# Patient Record
Sex: Female | Born: 1990 | Race: White | Hispanic: No | Marital: Single | Smoking: Former smoker
Health system: Southern US, Community
[De-identification: ages and names within clinical notes are randomized; demographics above are authoritative.]

## PROBLEM LIST (undated history)

## (undated) DIAGNOSIS — I471 Supraventricular tachycardia, unspecified: Secondary | ICD-10-CM

## (undated) DIAGNOSIS — B009 Herpesviral infection, unspecified: Secondary | ICD-10-CM

## (undated) DIAGNOSIS — F419 Anxiety disorder, unspecified: Secondary | ICD-10-CM

## (undated) DIAGNOSIS — Z808 Family history of malignant neoplasm of other organs or systems: Secondary | ICD-10-CM

## (undated) DIAGNOSIS — A749 Chlamydial infection, unspecified: Secondary | ICD-10-CM

## (undated) HISTORY — DX: Family history of malignant neoplasm of other organs or systems: Z80.8

## (undated) HISTORY — DX: Anxiety disorder, unspecified: F41.9

## (undated) HISTORY — DX: Herpesviral infection, unspecified: B00.9

## (undated) HISTORY — DX: Supraventricular tachycardia: I47.1

## (undated) HISTORY — PX: WISDOM TOOTH EXTRACTION: SHX21

## (undated) HISTORY — DX: Chlamydial infection, unspecified: A74.9

## (undated) HISTORY — DX: Supraventricular tachycardia, unspecified: I47.10

---

## 2019-12-12 NOTE — Progress Notes (Signed)
PCP:  Patient, No Pcp Per   Chief Complaint  Patient presents with  . Gynecologic Exam    flu shot  . STD testing     HPI:      Ms. Tiffany Watkins is a 29 y.o. No obstetric history on file. who LMP was Patient's last menstrual period was 11/25/2019 (approximate)., presents today for her NP annual examination.  Her menses are infrequent light bleeding with IUD. Dysmenorrhea mild, occurring.  Sex activity: not sexually active. Was last yr. Kyleena placed ~04/2018. Last Pap: not recent; no abn paps in past requiring tx Hx of STDs: chlamydia last yr, did tx; no TOC. 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   There is no FH of breast cancer. There is no FH of ovarian cancer. The patient does do self-breast exams. Mom with metastatic melanoma. Pt hasn't seen derm.   Tobacco use: The patient denies current or previous tobacco use. Alcohol use: social drinker No drug use.  Exercise: moderately active  She does not get adequate calcium but not Vitamin D in her diet.  She takes ativan sparingly for situational anxiety. Needs RF.   Past Medical History:  Diagnosis Date  . Anxiety   . Chlamydia   . Family history of melanoma   . 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 WISDOM TOOTH EXTRACTION      Family History  Problem Relation Age of Onset  . Hypertension Mother   . Melanoma Mother        mets to lungs  . Hypertension Father   . Breast cancer Neg Hx   . Ovarian cancer Neg Hx     Social History   Socioeconomic History  . Marital status: Single    Spouse name: Not on file  . Number of children: Not on file  . Years of education: Not on file  . Highest education level: Not on file  Occupational History  . Not on file  Tobacco Use  . Smoking status: Former Research scientist (life sciences)  . Smokeless tobacco: Never Used  Substance and Sexual Activity  . Alcohol use: Yes  . Drug use: Never  .  Sexual activity: Not Currently    Birth control/protection: I.U.D.    Comment: Kyleena  Other Topics Concern  . Not on file  Social History Narrative  . Not on file   Social Determinants of Health   Financial Resource Strain:   . Difficulty of Paying Living Expenses: Not on file  Food Insecurity:   . Worried About Charity fundraiser in the Last Year: Not on file  . Ran Out of Food in the Last Year: Not on file  Transportation Needs:   . Lack of Transportation (Medical): Not on file  . Lack of Transportation (Non-Medical): Not on file  Physical Activity:   . Days of Exercise per Week: Not on file  . Minutes of Exercise per Session: Not on file  Stress:   . Feeling of Stress : Not on file  Social Connections:   . Frequency of Communication with Friends and Family: Not on file  . Frequency of Social Gatherings with Friends and Family: Not on file  . Attends Religious Services: Not on file  . Active Member of Clubs or Organizations: Not on file  . Attends Archivist Meetings: Not on file  . Marital Status: Not  on file  Intimate Partner Violence:   . Fear of Current or Ex-Partner: Not on file  . Emotionally Abused: Not on file  . Physically Abused: Not on file  . Sexually Abused: Not on file     Current Outpatient Medications:  .  Levonorgestrel 19.5 MG IUD, by Intrauterine route., Disp: , Rfl:  .  LORazepam (ATIVAN) 0.5 MG tablet, Take 1 tablet (0.5 mg total) by mouth every 8 (eight) hours as needed for anxiety., Disp: 30 tablet, Rfl: 0     ROS:  Review of Systems  Constitutional: Negative for fatigue, fever and unexpected weight change.  Respiratory: Negative for cough, shortness of breath and wheezing.   Cardiovascular: Negative for chest pain, palpitations and leg swelling.  Gastrointestinal: Negative for blood in stool, constipation, diarrhea, nausea and vomiting.  Endocrine: Negative for cold intolerance, heat intolerance and polyuria.  Genitourinary:  Negative for dyspareunia, dysuria, flank pain, frequency, genital sores, hematuria, menstrual problem, pelvic pain, urgency, vaginal bleeding, vaginal discharge and vaginal pain.  Musculoskeletal: Negative for back pain, joint swelling and myalgias.  Skin: Negative for rash.  Neurological: Negative for dizziness, syncope, light-headedness, numbness and headaches.  Hematological: Negative for adenopathy.  Psychiatric/Behavioral: Positive for agitation. Negative for confusion, sleep disturbance and suicidal ideas. The patient is not nervous/anxious.   BREAST: No symptoms   Objective: BP 110/90   Ht 5\' 6"  (1.676 m)   Wt 276 lb (125.2 kg)   LMP 11/25/2019 (Approximate)   BMI 44.55 kg/m    Physical Exam Constitutional:      Appearance: She is well-developed.  Genitourinary:     Vulva, vagina, cervix, uterus, right adnexa and left adnexa normal.     No vulval lesion or tenderness noted.     No vaginal discharge, erythema or tenderness.     No cervical polyp.     IUD strings visualized.     Uterus is not enlarged or tender.     No right or left adnexal mass present.     Right adnexa not tender.     Left adnexa not tender.  Neck:     Thyroid: No thyromegaly.  Cardiovascular:     Rate and Rhythm: Normal rate and regular rhythm.     Heart sounds: Normal heart sounds. No murmur.  Pulmonary:     Effort: Pulmonary effort is normal.     Breath sounds: Normal breath sounds.  Chest:     Breasts:        Right: No mass, nipple discharge, skin change or tenderness.        Left: No mass, nipple discharge, skin change or tenderness.  Abdominal:     Palpations: Abdomen is soft.     Tenderness: There is no abdominal tenderness. There is no guarding.  Musculoskeletal:        General: Normal range of motion.     Cervical back: Normal range of motion.  Neurological:     General: No focal deficit present.     Mental Status: She is alert and oriented to person, place, and time.     Cranial  Nerves: No cranial nerve deficit.  Skin:    General: Skin is warm and dry.  Psychiatric:        Mood and Affect: Mood normal.        Behavior: Behavior normal.        Thought Content: Thought content normal.        Judgment: Judgment normal.  Vitals reviewed.  Assessment/Plan: Encounter for annual routine gynecological examination  Cervical cancer screening - Plan: Cytology - PAP  Chlamydia--TOC today. Will f/u with results.   Screening for STD (sexually transmitted disease) - Plan: Cytology - PAP  Encounter for routine checking of intrauterine contraceptive device (IUD); IUD in place, due for removal ~6/24  Anxiety - Plan: LORazepam (ATIVAN) 0.5 MG tablet; Rx RF. Pt takes sparingly prn.  Family history of melanoma--recommend derm screening; pt plans to sched appt  Needs flu shot - Plan: Flu Vaccine QUAD 36+ mos IM (Fluarix, Quad PF)  Meds ordered this encounter  Medications  . LORazepam (ATIVAN) 0.5 MG tablet    Sig: Take 1 tablet (0.5 mg total) by mouth every 8 (eight) hours as needed for anxiety.    Dispense:  30 tablet    Refill:  0    Order Specific Question:   Supervising Provider    Answer:   Gae Dry [id number]             GYN counsel adequate intake of calcium and vitamin D, diet and exercise     F/U  Return in about 1 year (around 12/12/2020).  Cantrell Larouche B. Awilda Covin, PA-C 12/13/2019 9:14 AM

## 2019-12-12 NOTE — Patient Instructions (Signed)
I value your feedback and entrusting us with your care. If you get a [facility] patient survey, I would appreciate you taking the time to let us know about your experience today. Thank you!  As of October 17, 2019, your lab results will be released to your MyChart immediately, before I even have a chance to see them. Please give me time to review them and contact you if there are any abnormalities. Thank you for your patience.  

## 2019-12-13 ENCOUNTER — Encounter: Payer: Self-pay | Admitting: Obstetrics and Gynecology

## 2019-12-13 ENCOUNTER — Ambulatory Visit (INDEPENDENT_AMBULATORY_CARE_PROVIDER_SITE_OTHER): Payer: BC Managed Care – PPO | Admitting: Obstetrics and Gynecology

## 2019-12-13 ENCOUNTER — Other Ambulatory Visit (HOSPITAL_COMMUNITY)
Admission: RE | Admit: 2019-12-13 | Discharge: 2019-12-13 | Disposition: A | Discharge status: Home / Self Care | Payer: BC Managed Care – PPO | Source: Ambulatory Visit | Attending: Obstetrics and Gynecology | Admitting: Obstetrics and Gynecology

## 2019-12-13 ENCOUNTER — Other Ambulatory Visit: Payer: Self-pay

## 2019-12-13 VITALS — BP 110/90 | Ht 66.0 in | Wt 276.0 lb

## 2019-12-13 DIAGNOSIS — Z124 Encounter for screening for malignant neoplasm of cervix: Secondary | ICD-10-CM | POA: Insufficient documentation

## 2019-12-13 DIAGNOSIS — Z30431 Encounter for routine checking of intrauterine contraceptive device: Secondary | ICD-10-CM

## 2019-12-13 DIAGNOSIS — A749 Chlamydial infection, unspecified: Secondary | ICD-10-CM | POA: Insufficient documentation

## 2019-12-13 DIAGNOSIS — Z01419 Encounter for gynecological examination (general) (routine) without abnormal findings: Secondary | ICD-10-CM

## 2019-12-13 DIAGNOSIS — Z01411 Encounter for gynecological examination (general) (routine) with abnormal findings: Secondary | ICD-10-CM

## 2019-12-13 DIAGNOSIS — Z113 Encounter for screening for infections with a predominantly sexual mode of transmission: Secondary | ICD-10-CM | POA: Diagnosis present

## 2019-12-13 DIAGNOSIS — Z808 Family history of malignant neoplasm of other organs or systems: Secondary | ICD-10-CM

## 2019-12-13 DIAGNOSIS — Z23 Encounter for immunization: Secondary | ICD-10-CM | POA: Diagnosis not present

## 2019-12-13 DIAGNOSIS — F419 Anxiety disorder, unspecified: Secondary | ICD-10-CM | POA: Diagnosis not present

## 2019-12-13 MED ORDER — LORAZEPAM 0.5 MG PO TABS: 0.5000 mg | ORAL_TABLET | Freq: Three times a day (TID) | ORAL | 0 refills | Status: DC | PRN

## 2019-12-18 ENCOUNTER — Telehealth: Payer: Self-pay | Admitting: Obstetrics and Gynecology

## 2019-12-18 DIAGNOSIS — A749 Chlamydial infection, unspecified: Secondary | ICD-10-CM

## 2019-12-18 LAB — CYTOLOGY - PAP
Chlamydia: POSITIVE — AB
Comment: NEGATIVE
Comment: NORMAL
Neisseria Gonorrhea: NEGATIVE

## 2019-12-18 MED ORDER — AZITHROMYCIN 500 MG PO TABS: 1000.0000 mg | ORAL_TABLET | Freq: Once | ORAL | 0 refills | Status: AC

## 2019-12-18 NOTE — Telephone Encounter (Signed)
[facility] notified.

## 2019-12-18 NOTE — Telephone Encounter (Signed)
Patient is calling for labs results. Please advise. 

## 2019-12-18 NOTE — Telephone Encounter (Signed)
Pt aware of LGSIL pap and need for colpo. Appt sched.  Also aware of chlamydia, not currently sex active. Notify ex-partner. Rx azithro. RTO in 4 wks for TOC or at colpo if a few wks out. CMA to notify [facility].

## 2020-01-06 ENCOUNTER — Other Ambulatory Visit (HOSPITAL_COMMUNITY)
Admission: RE | Admit: 2020-01-06 | Discharge: 2020-01-06 | Disposition: A | Discharge status: Home / Self Care | Payer: BC Managed Care – PPO | Source: Ambulatory Visit | Attending: Obstetrics and Gynecology | Admitting: Obstetrics and Gynecology

## 2020-01-06 ENCOUNTER — Ambulatory Visit (INDEPENDENT_AMBULATORY_CARE_PROVIDER_SITE_OTHER): Payer: BC Managed Care – PPO | Admitting: Obstetrics and Gynecology

## 2020-01-06 ENCOUNTER — Encounter: Payer: Self-pay | Admitting: Obstetrics and Gynecology

## 2020-01-06 ENCOUNTER — Other Ambulatory Visit: Payer: Self-pay

## 2020-01-06 VITALS — BP 122/84 | HR 104 | Ht 66.0 in | Wt 273.0 lb

## 2020-01-06 DIAGNOSIS — Z113 Encounter for screening for infections with a predominantly sexual mode of transmission: Secondary | ICD-10-CM | POA: Diagnosis not present

## 2020-01-06 DIAGNOSIS — R87612 Low grade squamous intraepithelial lesion on cytologic smear of cervix (LGSIL): Secondary | ICD-10-CM | POA: Diagnosis present

## 2020-01-06 NOTE — Progress Notes (Signed)
Obstetrics & Gynecology Office Visit   Chief Complaint:  Chief Complaint  Patient presents with  . Colposcopy    referred by ABC/STD TOC today    History of Present Illness:Tiffany Watkins is a 29 y.o. woman who presents today for continued surveillance for history of dysplasia. Last pap obtained on 12/13/2019 revealed LGSIL.  No history of LEEP.  Not currently sexually active.  Also concurrently treated for chlamydia.    Pap/Treatment History:  02/16/2015 NIL 09/12/2013 NIL trichomonas 01/18/2012 NIL 02/17/2011 LGSIL with areas concerning for higher grade changes present 09/06/2010 LGSIL  Review of Systems: Review of Systems  Constitutional: Negative.   Gastrointestinal: Negative.   Genitourinary: Negative.      Past Medical History:  Past Medical History:  Diagnosis Date  . Anxiety   . Chlamydia   . Family history of melanoma   . 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 WISDOM TOOTH EXTRACTION      Gynecologic History: No LMP recorded. (Menstrual status: IUD).  Obstetric History: G0P0000  Family History:  Family History  Problem Relation Age of Onset  . Hypertension Mother   . Melanoma Mother        mets to lungs  . Hypertension Father   . Breast cancer Neg Hx   . Ovarian cancer Neg Hx     Social History:  Social History   Socioeconomic History  . Marital status: Single    Spouse name: Not on file  . Number of children: Not on file  . Years of education: Not on file  . Highest education level: Not on file  Occupational History  . Not on file  Tobacco Use  . Smoking status: Former Research scientist (life sciences)  . Smokeless tobacco: Never Used  Substance and Sexual Activity  . Alcohol use: Yes  . Drug use: Never  . Sexual activity: Not Currently    Birth control/protection: I.U.D.    Comment: Kyleena  Other Topics Concern  . Not on file  Social History Narrative  . Not on file    Social Determinants of Health   Financial Resource Strain:   . Difficulty of Paying Living Expenses: Not on file  Food Insecurity:   . Worried About Charity fundraiser in the Last Year: Not on file  . Ran Out of Food in the Last Year: Not on file  Transportation Needs:   . Lack of Transportation (Medical): Not on file  . Lack of Transportation (Non-Medical): Not on file  Physical Activity:   . Days of Exercise per Week: Not on file  . Minutes of Exercise per Session: Not on file  Stress:   . Feeling of Stress : Not on file  Social Connections:   . Frequency of Communication with Friends and Family: Not on file  . Frequency of Social Gatherings with Friends and Family: Not on file  . Attends Religious Services: Not on file  . Active Member of Clubs or Organizations: Not on file  . Attends Archivist Meetings: Not on file  . Marital Status: Not on file  Intimate Partner Violence:   . Fear of Current or Ex-Partner: Not on file  . Emotionally Abused: Not on file  . Physically Abused: Not on file  . Sexually Abused: Not on file    Allergies:  No Known Allergies  Medications: Prior to Admission medications   Medication  Sig Start Date End Date Taking? Authorizing Provider  Levonorgestrel 19.5 MG IUD by Intrauterine route.    [provider]  LORazepam (ATIVAN) 0.5 MG tablet Take 1 tablet (0.5 mg total) by mouth every 8 (eight) hours as needed for anxiety. [id number]   Copland, Deirdre Evener, PA-C    Physical Exam Vitals:  Vitals:   01/06/20 1030  BP: 122/84  Pulse: (!) 104   No LMP recorded. (Menstrual status: IUD).  General: NAD HEENT: normocephalic, anicteric Thyroid: no enlargement, no palpable nodules Pulmonary: No increased work of breathing Genitourinary:  External: Normal external female genitalia.  Normal urethral meatus, normal  Bartholin's and Skene's glands.    Vagina: Normal vaginal mucosa, no evidence of prolapse.    Cervix: Grossly normal  in appearance, no bleeding  Uterus: Non-enlarged, mobile, normal contour.  No CMT  Adnexa: ovaries non-enlarged, no adnexal masses  Rectal: deferred  Lymphatic: no evidence of inguinal lymphadenopathy Extremities: no edema, erythema, or tenderness Neurologic: Grossly intact Psychiatric: mood appropriate, affect full  Female chaperone present for pelvic and breast  portions of the physical exam   GYNECOLOGY CLINIC COLPOSCOPY PROCEDURE NOTE  29 y.o. G0P0000 here for colposcopy for low-grade squamous intraepithelial neoplasia (LGSIL - encompassing HPV,mild dysplasia,CIN I)  pap smear on 12/13/2019. Discussed underlying role for HPV infection in the development of cervical dysplasia, its natural history and progression/regression, need for surveillance.     Is the patient  pregnant: No LMP: No LMP recorded. (Menstrual status: IUD). Smoking status:  reports that she has quit smoking. She has never used smokeless tobacco. Contraception: IUD Number current sexual partners:  0 History of STD:  Yes chlamdydia and trichomonas Future fertility desired:  Yes  Patient given informed consent, signed copy in the chart, time out was performed.  The patient was position in dorsal lithotomy position. Speculum was placed the cervix was visualized.   After application of acetic acid colposcopic inspection of the cervix was undertaken.   Colposcopy adequate, full visualization of transformation zone: Yes acetowhite lesion(s) noted at 12 o'clock; corresponding biopsies obtained.   ECC specimen obtained:  Yes  All specimens were labeled and sent to pathology.   Patient was given post procedure instructions.  Will follow up pathology and manage accordingly.  Routine preventative health maintenance measures emphasized.  Physical Exam Genitourinary:           Assessment: 29 y.o. G0P0000 follow up for LGSIL pap  Plan: Problem List Items Addressed This Visit    None    Visit Diagnoses     LGSIL on Pap smear of cervix    -  Primary   Relevant Orders   Surgical pathology   Routine screening for STI (sexually transmitted infection)       Relevant Orders   Cervicovaginal ancillary only      - Follow up GC/CT obtained today.   - I had a lengthly discussion with Anona Bree  regarding the cause of dysplasia of the lower genital tract (including immunosuppression in the setting of HPV exposure and tobacco exposure). I explained the potential for progression to invasive malignancy, the recurrent nature of these lesions (and the need for close continued followup). Results of today's pap will dictate need for further evaluation and follow up per ASCCP guidelines..  We discussed that 80% of the population will have exposure to human papilloma virus (HPV) during their lifetime.  HPV is a large group of viruses, and are also the causative virus for common warts and genital  warts.  The pap smear tests for 13 high risk HPV strains that have some association with cervical cancer, but do not cause visual lesion such as warts.  HPV type 16 and 18 have the highest association with cervical cancer.  The vast majority of HPV infections will be uncomplicated at clear spontaneously in 12-18 months in non-immunocompromised patients.  Patient with compromised immune systems, those taking immunosuppressive drugs, or smoker have shown to have a lower clearance rate and higher persistence of HPV infection.    Currently there are no FDA approved treatments to promote HPV clearance.  Gardasil vaccination is available to prevent HPV infection, but this is only beneficial pre-exposure.  Abstaining from intercourse will not increase clearance  Lastly we stressed that if properly followed HPV should not lead to cervical cancer.   The goal of screening is to identify patient who develop precancerous lesions of the cervix and treat these prior to progression to frank cervical cancer.  The incidence of cervical cancer  is 7 cases per 100,000 women in the [place] a year.  This relatively low rate is in part due to universal screening as well as vaccination efforts.    - She is comfortable with the plan and had her questions answered.  - Return in about 1 year (around 01/05/2021) for annual.   Malachy Mood, MD, [place], [facility] 01/06/2020, 10:54 AM

## 2020-01-07 LAB — SURGICAL PATHOLOGY

## 2020-01-07 LAB — CERVICOVAGINAL ANCILLARY ONLY
Chlamydia: NEGATIVE
Comment: NEGATIVE
Comment: NEGATIVE
Comment: NORMAL
Neisseria Gonorrhea: NEGATIVE
Trichomonas: NEGATIVE

## 2020-02-13 ENCOUNTER — Other Ambulatory Visit: Payer: Self-pay

## 2020-02-13 ENCOUNTER — Ambulatory Visit: Payer: BC Managed Care – PPO | Admitting: Dermatology

## 2020-02-13 DIAGNOSIS — D235 Other benign neoplasm of skin of trunk: Secondary | ICD-10-CM

## 2020-02-13 DIAGNOSIS — L709 Acne, unspecified: Secondary | ICD-10-CM

## 2020-02-13 DIAGNOSIS — Z1283 Encounter for screening for malignant neoplasm of skin: Secondary | ICD-10-CM

## 2020-02-13 DIAGNOSIS — D2372 Other benign neoplasm of skin of left lower limb, including hip: Secondary | ICD-10-CM

## 2020-02-13 DIAGNOSIS — D225 Melanocytic nevi of trunk: Secondary | ICD-10-CM | POA: Diagnosis not present

## 2020-02-13 DIAGNOSIS — D229 Melanocytic nevi, unspecified: Secondary | ICD-10-CM | POA: Diagnosis not present

## 2020-02-13 DIAGNOSIS — L988 Other specified disorders of the skin and subcutaneous tissue: Secondary | ICD-10-CM

## 2020-02-13 DIAGNOSIS — L720 Epidermal cyst: Secondary | ICD-10-CM

## 2020-02-13 DIAGNOSIS — L578 Other skin changes due to chronic exposure to nonionizing radiation: Secondary | ICD-10-CM

## 2020-02-13 DIAGNOSIS — Z808 Family history of malignant neoplasm of other organs or systems: Secondary | ICD-10-CM

## 2020-02-13 DIAGNOSIS — L814 Other melanin hyperpigmentation: Secondary | ICD-10-CM

## 2020-02-13 NOTE — Progress Notes (Signed)
   New Patient Visit  Subjective  Tiffany Watkins is a 29 y.o. female who presents for the following: Nevus (wants to have moles checked, has one center chest under breasts that has gotten larger in past few years, and darker in the center for past year), discolored corners of mouth (patient states that it has a different texture to that area, has been there for past month or month and 1/2. not itch or pain noted, she just doesn't like the appearance), and No hx skin cancer (Patient has no hx of skin cancer. Her mother has a history of melanoma that metastasized to the lungs and is currently undergoing treatment.  The patient denies dark streaks at fingernails and toenails.   No dark streaks under finger or toe nails  Objective  Well appearing patient in no apparent distress; mood and affect are within normal limits.  A full examination was performed including scalp, head, eyes, ears, nose, lips, neck, chest, axillae, abdomen, back, buttocks, bilateral upper extremities, bilateral lower extremities, hands, feet, fingers, toes, fingernails, and toenails. All findings within normal limits unless otherwise noted below.  Objective  Right Cheek: Erythematous papules at face  Objective  Bilateral oral commisures: Volume loss at angles of mouth  Objective  Right Upper Back: Subcutaneous nodule.   Objective  Upper abdomen: 0.8 cm med brown thin papule with fried egg pattern  Right Inframammary Fold: Cluster of nevi right breast 7 o'clock  Left medial buttock: 1.0 cm thin brown thin papule with lighter focus at 11 o'clock  Images              Assessment & Plan  Acne, unspecified acne type Right Cheek  Not bothersome to patient at this time.  Will defer treatment.  Elastosis of skin Bilateral oral commisures  Discussed fillers for areas on corners of mouth. Patient defers  Epidermal inclusion cyst Right Upper Back  Benign, observe.  Discussed option of surgery if  removal is desired.  Patient defers   Nevus (3) Left medial buttock; Upper abdomen; Right Inframammary Fold  Benign-appearing.  Observation.  Call clinic for new or changing moles.  Recommend daily use of broad spectrum spf 30+ sunscreen to sun-exposed areas.  Photo today  Melanocytic Nevi - Tan-brown and/or pink-flesh-colored symmetric macules and papules - Benign appearing on exam today - Observation - Call clinic for new or changing moles - Recommend daily use of broad spectrum spf 30+ sunscreen to sun-exposed areas.   Lentigines - Scattered tan macules - Discussed due to sun exposure - Benign, observe - Call for any changes  Dermatofibroma - Firm pink/brown papulenodule with dimple sign - Benign appearing - Call for any changes Left shin, left buttock  Actinic Damage - diffuse scaly erythematous macules with underlying dyspigmentation - Recommend daily broad spectrum sunscreen SPF 30+ to sun-exposed areas, reapply every 2 hours as needed.  - Call for new or changing lesions.  Skin cancer screening performed today.   Return in about 1 year (around 02/12/2021) for TBSE.   I, Donzetta Kohut, CMA, am acting as scribe for Forest Gleason, MD .  Documentation: I have reviewed the above documentation for accuracy and completeness, and I agree with the above.  Forest Gleason, MD

## 2020-02-13 NOTE — Patient Instructions (Addendum)
Recommend daily broad spectrum sunscreen SPF 30+ to sun-exposed areas, reapply every 2 hours as needed. Call for new or changing lesions. Melanoma ABCDEs  Melanoma is the most dangerous type of skin cancer, and is the leading cause of death from skin disease.  You are more likely to develop melanoma if you:  Have light-colored skin, light-colored eyes, or red or blond hair  Spend a lot of time in the sun  Tan regularly, either outdoors or in a tanning bed  Have had blistering sunburns, especially during childhood  Have a close family member who has had a melanoma  Have atypical moles or large birthmarks  Early detection of melanoma is key since treatment is typically straightforward and cure rates are extremely high if we catch it early.   The first sign of melanoma is often a change in a mole or a new dark spot.  The ABCDE system is a way of remembering the signs of melanoma.  A for asymmetry:  The two halves do not match. B for border:  The edges of the growth are irregular. C for color:  A mixture of colors are present instead of an even brown color. D for diameter:  Melanomas are usually (but not always) greater than 6mm - the size of a pencil eraser. E for evolution:  The spot keeps changing in size, shape, and color.  Please check your skin once per month between visits. You can use a small mirror in front and a large mirror behind you to keep an eye on the back side or your body.   If you see any new or changing lesions before your next follow-up, please call to schedule a visit.  Please continue daily skin protection including broad spectrum sunscreen SPF 30+ to sun-exposed areas, reapplying every 2 hours as needed when you're outdoors.   

## 2020-02-20 ENCOUNTER — Encounter: Payer: Self-pay | Admitting: Dermatology

## 2020-05-07 DIAGNOSIS — F430 Acute stress reaction: Secondary | ICD-10-CM | POA: Diagnosis not present

## 2020-05-08 DIAGNOSIS — M545 Low back pain: Secondary | ICD-10-CM | POA: Diagnosis not present

## 2020-06-11 DIAGNOSIS — F430 Acute stress reaction: Secondary | ICD-10-CM | POA: Diagnosis not present

## 2020-08-07 DIAGNOSIS — R002 Palpitations: Secondary | ICD-10-CM | POA: Diagnosis not present

## 2020-08-07 DIAGNOSIS — R000 Tachycardia, unspecified: Secondary | ICD-10-CM | POA: Diagnosis not present

## 2020-08-16 DIAGNOSIS — M25512 Pain in left shoulder: Secondary | ICD-10-CM | POA: Diagnosis not present

## 2020-08-16 DIAGNOSIS — R202 Paresthesia of skin: Secondary | ICD-10-CM | POA: Diagnosis not present

## 2020-08-16 DIAGNOSIS — D72829 Elevated white blood cell count, unspecified: Secondary | ICD-10-CM | POA: Diagnosis not present

## 2020-08-16 DIAGNOSIS — F419 Anxiety disorder, unspecified: Secondary | ICD-10-CM | POA: Diagnosis not present

## 2020-08-16 DIAGNOSIS — R000 Tachycardia, unspecified: Secondary | ICD-10-CM | POA: Diagnosis not present

## 2020-08-16 DIAGNOSIS — M542 Cervicalgia: Secondary | ICD-10-CM | POA: Diagnosis not present

## 2020-08-16 DIAGNOSIS — R002 Palpitations: Secondary | ICD-10-CM | POA: Diagnosis not present

## 2020-08-21 DIAGNOSIS — R0602 Shortness of breath: Secondary | ICD-10-CM | POA: Diagnosis not present

## 2020-08-21 DIAGNOSIS — F419 Anxiety disorder, unspecified: Secondary | ICD-10-CM | POA: Diagnosis not present

## 2020-08-21 DIAGNOSIS — R002 Palpitations: Secondary | ICD-10-CM | POA: Diagnosis not present

## 2020-09-09 DIAGNOSIS — R002 Palpitations: Secondary | ICD-10-CM | POA: Diagnosis not present

## 2020-09-09 DIAGNOSIS — R0602 Shortness of breath: Secondary | ICD-10-CM | POA: Diagnosis not present

## 2020-09-21 DIAGNOSIS — R002 Palpitations: Secondary | ICD-10-CM | POA: Diagnosis not present

## 2020-11-20 DIAGNOSIS — Z20822 Contact with and (suspected) exposure to covid-19: Secondary | ICD-10-CM | POA: Diagnosis not present

## 2020-11-20 DIAGNOSIS — J069 Acute upper respiratory infection, unspecified: Secondary | ICD-10-CM | POA: Diagnosis not present

## 2020-11-24 DIAGNOSIS — Z20822 Contact with and (suspected) exposure to covid-19: Secondary | ICD-10-CM | POA: Diagnosis not present

## 2020-11-24 DIAGNOSIS — J069 Acute upper respiratory infection, unspecified: Secondary | ICD-10-CM | POA: Diagnosis not present

## 2021-01-01 DIAGNOSIS — E282 Polycystic ovarian syndrome: Secondary | ICD-10-CM | POA: Insufficient documentation

## 2021-01-04 ENCOUNTER — Other Ambulatory Visit: Payer: Self-pay

## 2021-01-04 ENCOUNTER — Other Ambulatory Visit (HOSPITAL_COMMUNITY)
Admission: RE | Admit: 2021-01-04 | Discharge: 2021-01-04 | Disposition: A | Discharge status: Home / Self Care | Payer: BC Managed Care – PPO | Source: Ambulatory Visit | Attending: Obstetrics and Gynecology | Admitting: Obstetrics and Gynecology

## 2021-01-04 ENCOUNTER — Ambulatory Visit (INDEPENDENT_AMBULATORY_CARE_PROVIDER_SITE_OTHER): Payer: BC Managed Care – PPO | Admitting: Obstetrics and Gynecology

## 2021-01-04 ENCOUNTER — Encounter: Payer: Self-pay | Admitting: Obstetrics and Gynecology

## 2021-01-04 VITALS — BP 142/90 | Ht 66.0 in | Wt 267.0 lb

## 2021-01-04 DIAGNOSIS — Z124 Encounter for screening for malignant neoplasm of cervix: Secondary | ICD-10-CM | POA: Diagnosis not present

## 2021-01-04 DIAGNOSIS — Z1151 Encounter for screening for human papillomavirus (HPV): Secondary | ICD-10-CM | POA: Diagnosis not present

## 2021-01-04 DIAGNOSIS — R87612 Low grade squamous intraepithelial lesion on cytologic smear of cervix (LGSIL): Secondary | ICD-10-CM

## 2021-01-04 DIAGNOSIS — R002 Palpitations: Secondary | ICD-10-CM | POA: Diagnosis not present

## 2021-01-04 DIAGNOSIS — F419 Anxiety disorder, unspecified: Secondary | ICD-10-CM

## 2021-01-04 DIAGNOSIS — Z113 Encounter for screening for infections with a predominantly sexual mode of transmission: Secondary | ICD-10-CM | POA: Insufficient documentation

## 2021-01-04 DIAGNOSIS — L680 Hirsutism: Secondary | ICD-10-CM | POA: Diagnosis not present

## 2021-01-04 DIAGNOSIS — Z01419 Encounter for gynecological examination (general) (routine) without abnormal findings: Secondary | ICD-10-CM

## 2021-01-04 DIAGNOSIS — I471 Supraventricular tachycardia, unspecified: Secondary | ICD-10-CM | POA: Insufficient documentation

## 2021-01-04 DIAGNOSIS — Z30431 Encounter for routine checking of intrauterine contraceptive device: Secondary | ICD-10-CM

## 2021-01-04 DIAGNOSIS — R079 Chest pain, unspecified: Secondary | ICD-10-CM | POA: Diagnosis not present

## 2021-01-04 MED ORDER — LORAZEPAM 0.5 MG PO TABS: 0.5000 mg | ORAL_TABLET | Freq: Three times a day (TID) | ORAL | 0 refills | Status: DC | PRN

## 2021-01-04 NOTE — Patient Instructions (Signed)
I value your feedback and you entrusting us with your care. If you get a [facility] patient survey, I would appreciate you taking the time to let us know about your experience today. Thank you! ? ? ?

## 2021-01-04 NOTE — Progress Notes (Signed)
PCP:  Patient, No Pcp Per   Chief Complaint  Patient presents with  . Gynecologic Exam    Has PCOS qs  . Vaginal Discharge    No odor or itchiness x 1 month     HPI:      Ms. Tiffany Watkins is a 30 y.o. No obstetric history on file. who LMP was Patient's last menstrual period was 11/27/2020 (approximate)., presents today for her  annual examination.  Her menses are infrequent, light bleeding with IUD, lasting a few days to a wk, rare BTB. Dysmenorrhea mild.  Has had issues with chin hair recently. Also a patch of chest hair. Doing laser hair removal but has to shave chin hairs regularly. Had monthly menses prior to IUD.   Sex activity: currently sexually active. Contraception--condoms and Kyleena placed ~04/2018. 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 Georgianne Fick; repeat pap due today. Wants STD testing. Hx of STDs: Hx of HPV on pap in 2011/2012. chlamydia 2020 and 2021, did tx; neg TOC 01/06/20. 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   Has had increased d/c without irritation/odor for past month. Notes it changes throughout her cycle.   There is no FH of breast cancer. There is no FH of ovarian cancer. The patient does do self-breast exams. Mom with metastatic melanoma. Pt sees derm for mole checks.   Tobacco use: The patient denies current or previous tobacco use. Alcohol use: social drinker No drug use.  Exercise: min active  She does get adequate calcium but not Vitamin D in her diet.  She takes ativan sparingly for situational anxiety. Needs RF.   Past Medical History:  Diagnosis Date  . Anxiety   . Chlamydia   . Family history of melanoma   . 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 SVT (supraventricular tachycardia) (HCC)      Past Surgical History:  Procedure Laterality Date  . WISDOM TOOTH EXTRACTION      Family History  Problem Relation Age of Onset  . Hypertension Mother   . Melanoma  Mother        mets to lungs  . Hypertension Father   . Breast cancer Neg Hx   . Ovarian cancer Neg Hx     Social History   Socioeconomic History  . Marital status: Single    Spouse name: Not on file  . Number of children: Not on file  . Years of education: Not on file  . Highest education level: Not on file  Occupational History  . Not on file  Tobacco Use  . Smoking status: Former Research scientist (life sciences)  . Smokeless tobacco: Never Used  Vaping Use  . Vaping Use: Former  Substance and Sexual Activity  . Alcohol use: Yes  . Drug use: Never  . Sexual activity: Yes    Birth control/protection: I.U.D.    Comment: Kyleena  Other Topics Concern  . Not on file  Social History Narrative  . Not on file   Social Determinants of Health   Financial Resource Strain: Not on file  Food Insecurity: Not on file  Transportation Needs: Not on file  Physical Activity: Not on file  Stress: Not on file  Social Connections: Not on file  Intimate Partner Violence: Not on file     Current Outpatient Medications:  .  buPROPion (WELLBUTRIN XL) 150 MG 24 hr tablet, Take 1 tablet by mouth  daily., Disp: , Rfl:  .  diltiazem (CARDIZEM) 30 MG tablet, Take by mouth., Disp: , Rfl:  .  Levonorgestrel 19.5 MG IUD, by Intrauterine route., Disp: , Rfl:  .  LORazepam (ATIVAN) 0.5 MG tablet, Take 1 tablet (0.5 mg total) by mouth every 8 (eight) hours as needed for anxiety., Disp: 30 tablet, Rfl: 0     ROS:  Review of Systems  Constitutional: Negative for fatigue, fever and unexpected weight change.  Respiratory: Negative for cough, shortness of breath and wheezing.   Cardiovascular: Negative for chest pain, palpitations and leg swelling.  Gastrointestinal: Negative for blood in stool, constipation, diarrhea, nausea and vomiting.  Endocrine: Negative for cold intolerance, heat intolerance and polyuria.  Genitourinary: Positive for vaginal discharge. Negative for dyspareunia, dysuria, flank pain, frequency,  genital sores, hematuria, menstrual problem, pelvic pain, urgency, vaginal bleeding and vaginal pain.  Musculoskeletal: Negative for back pain, joint swelling and myalgias.  Skin: Negative for rash.  Neurological: Negative for dizziness, syncope, light-headedness, numbness and headaches.  Hematological: Negative for adenopathy.  Psychiatric/Behavioral: Positive for agitation. Negative for confusion, sleep disturbance and suicidal ideas. The patient is not nervous/anxious.   BREAST: No symptoms   Objective: BP (!) 142/90   Ht 5\' 6"  (1.676 m)   Wt 267 lb (121.1 kg)   LMP 11/27/2020 (Approximate)   BMI 43.09 kg/m    Physical Exam Constitutional:      Appearance: She is well-developed.  Genitourinary:     Vulva normal.     Right Labia: No rash, tenderness or lesions.    Left Labia: No tenderness, lesions or rash.    No vaginal discharge, erythema or tenderness.      Right Adnexa: not tender and no mass present.    Left Adnexa: not tender and no mass present.    No cervical friability or polyp.     IUD strings visualized.     Uterus is not enlarged or tender.  Breasts:     Right: No mass, nipple discharge, skin change or tenderness.     Left: No mass, nipple discharge, skin change or tenderness.    Neck:     Thyroid: No thyromegaly.  Cardiovascular:     Rate and Rhythm: Normal rate and regular rhythm.     Heart sounds: Normal heart sounds. No murmur heard.   Pulmonary:     Effort: Pulmonary effort is normal.     Breath sounds: Normal breath sounds.  Abdominal:     Palpations: Abdomen is soft.     Tenderness: There is no abdominal tenderness. There is no guarding or rebound.  Musculoskeletal:        General: Normal range of motion.     Cervical back: Normal range of motion.  Lymphadenopathy:     Cervical: No cervical adenopathy.  Neurological:     General: No focal deficit present.     Mental Status: She is alert and oriented to person, place, and time.     Cranial  Nerves: No cranial nerve deficit.  Skin:    General: Skin is warm and dry.  Psychiatric:        Mood and Affect: Mood normal.        Behavior: Behavior normal.        Thought Content: Thought content normal.        Judgment: Judgment normal.  Vitals reviewed.     Assessment/Plan: Encounter for annual routine gynecological examination  Cervical cancer screening - Plan: Cytology - PAP,  Screening for STD (sexually transmitted disease) - Plan: Cytology - PAP  Screening for HPV (human papillomavirus) - Plan: Cytology - PAP,   LGSIL on Pap smear of cervix - Plan: Cytology - PAP, repeat pap today. Will f/u with results  Encounter for routine checking of intrauterine contraceptive device (IUD); IUD strings in cx os  Anxiety - Plan: LORazepam (ATIVAN) 0.5 MG tablet; Rx RF. Takes sparingly  Female hirsutism - Plan: Testosterone; check labs. If WNL, suggest pt discuss spironolactone with derm at her upcoming appt. Most likely not PCOS given hx of monthly menses prior to IUD.   Family history of melanoma--pt has upcoming derm appt   Meds ordered this encounter  Medications  . LORazepam (ATIVAN) 0.5 MG tablet    Sig: Take 1 tablet (0.5 mg total) by mouth every 8 (eight) hours as needed for anxiety.    Dispense:  30 tablet    Refill:  0    Order Specific Question:   Supervising Provider    Answer:   Gae Dry [[id number]]             GYN counsel adequate intake of calcium and vitamin D, diet and exercise     F/U  Return in about 1 year (around 01/04/2022).  Apostolos Blagg B. Dorsie Burich, PA-C 01/04/2021 3:22 PM

## 2021-01-05 ENCOUNTER — Other Ambulatory Visit: Payer: BC Managed Care – PPO

## 2021-01-05 ENCOUNTER — Telehealth: Payer: Self-pay | Admitting: Obstetrics and Gynecology

## 2021-01-05 DIAGNOSIS — E278 Other specified disorders of adrenal gland: Secondary | ICD-10-CM

## 2021-01-05 DIAGNOSIS — Z6841 Body Mass Index (BMI) 40.0 and over, adult: Secondary | ICD-10-CM

## 2021-01-05 DIAGNOSIS — L680 Hirsutism: Secondary | ICD-10-CM | POA: Diagnosis not present

## 2021-01-05 DIAGNOSIS — R7989 Other specified abnormal findings of blood chemistry: Secondary | ICD-10-CM

## 2021-01-05 DIAGNOSIS — L659 Nonscarring hair loss, unspecified: Secondary | ICD-10-CM

## 2021-01-05 LAB — TESTOSTERONE: Testosterone: 109 ng/dL — ABNORMAL HIGH (ref 13–71)

## 2021-01-05 NOTE — Telephone Encounter (Signed)
Pt aware of elevated testosterone levels with increased facial hair. Check addl labs.

## 2021-01-07 LAB — CYTOLOGY - PAP
Chlamydia: NEGATIVE
Comment: NEGATIVE
Comment: NEGATIVE
Comment: NORMAL
Diagnosis: NEGATIVE
High risk HPV: POSITIVE — AB
Neisseria Gonorrhea: NEGATIVE

## 2021-01-11 DIAGNOSIS — M62830 Muscle spasm of back: Secondary | ICD-10-CM | POA: Diagnosis not present

## 2021-01-11 DIAGNOSIS — M5127 Other intervertebral disc displacement, lumbosacral region: Secondary | ICD-10-CM | POA: Diagnosis not present

## 2021-01-11 DIAGNOSIS — M9903 Segmental and somatic dysfunction of lumbar region: Secondary | ICD-10-CM | POA: Diagnosis not present

## 2021-01-11 DIAGNOSIS — M4607 Spinal enthesopathy, lumbosacral region: Secondary | ICD-10-CM | POA: Diagnosis not present

## 2021-01-12 DIAGNOSIS — N912 Amenorrhea, unspecified: Secondary | ICD-10-CM | POA: Diagnosis not present

## 2021-01-13 LAB — FSH/LH
FSH: 6.7 m[IU]/mL
LH: 13.7 m[IU]/mL

## 2021-01-13 LAB — PROLACTIN: Prolactin: 13.2 ng/mL (ref 4.8–23.3)

## 2021-01-13 LAB — DHEA-SULFATE: DHEA-SO4: 641 ug/dL — ABNORMAL HIGH (ref 84.8–378.0)

## 2021-01-13 LAB — HEMOGLOBIN A1C
Est. average glucose Bld gHb Est-mCnc: 91 mg/dL
Hgb A1c MFr Bld: 4.8 % (ref 4.8–5.6)

## 2021-01-13 LAB — TSH: TSH: 1.74 u[IU]/mL (ref 0.450–4.500)

## 2021-01-13 LAB — PROGESTERONE: Progesterone: 0.2 ng/mL

## 2021-01-13 LAB — ESTRADIOL: Estradiol: 49.8 pg/mL

## 2021-01-13 LAB — ANDROSTENEDIONE: Androstenedione: 158 ng/dL (ref 41–262)

## 2021-01-13 LAB — 17-HYDROXYPROGESTERONE: 17-Hydroxyprogesterone: 17 ng/dL

## 2021-01-14 ENCOUNTER — Encounter: Payer: Self-pay | Admitting: Obstetrics and Gynecology

## 2021-01-14 NOTE — Telephone Encounter (Signed)
Already discussed this with pt before receiving her message.

## 2021-01-15 DIAGNOSIS — R079 Chest pain, unspecified: Secondary | ICD-10-CM | POA: Diagnosis not present

## 2021-01-18 ENCOUNTER — Other Ambulatory Visit: Payer: Self-pay

## 2021-01-18 ENCOUNTER — Telehealth: Payer: Self-pay

## 2021-01-18 DIAGNOSIS — E278 Other specified disorders of adrenal gland: Secondary | ICD-10-CM

## 2021-01-18 DIAGNOSIS — M4607 Spinal enthesopathy, lumbosacral region: Secondary | ICD-10-CM | POA: Diagnosis not present

## 2021-01-18 DIAGNOSIS — M62830 Muscle spasm of back: Secondary | ICD-10-CM | POA: Diagnosis not present

## 2021-01-18 DIAGNOSIS — M9903 Segmental and somatic dysfunction of lumbar region: Secondary | ICD-10-CM | POA: Diagnosis not present

## 2021-01-18 DIAGNOSIS — L680 Hirsutism: Secondary | ICD-10-CM

## 2021-01-18 DIAGNOSIS — L659 Nonscarring hair loss, unspecified: Secondary | ICD-10-CM

## 2021-01-18 DIAGNOSIS — M5127 Other intervertebral disc displacement, lumbosacral region: Secondary | ICD-10-CM | POA: Diagnosis not present

## 2021-01-18 DIAGNOSIS — R7989 Other specified abnormal findings of blood chemistry: Secondary | ICD-10-CM

## 2021-01-18 NOTE — Telephone Encounter (Signed)
Orders released and faxed 

## 2021-01-18 NOTE — Telephone Encounter (Signed)
Pt calling; [facility] put in lab orders for her at [facility]; is at appt c [place] not and they do not have orders. Their fax # is [phone]

## 2021-01-18 NOTE — Telephone Encounter (Signed)
Pt aware.

## 2021-01-19 DIAGNOSIS — L659 Nonscarring hair loss, unspecified: Secondary | ICD-10-CM | POA: Diagnosis not present

## 2021-01-19 DIAGNOSIS — E278 Other specified disorders of adrenal gland: Secondary | ICD-10-CM | POA: Diagnosis not present

## 2021-01-19 DIAGNOSIS — L680 Hirsutism: Secondary | ICD-10-CM | POA: Diagnosis not present

## 2021-01-19 DIAGNOSIS — R7989 Other specified abnormal findings of blood chemistry: Secondary | ICD-10-CM | POA: Diagnosis not present

## 2021-01-20 ENCOUNTER — Encounter: Payer: Self-pay | Admitting: Obstetrics and Gynecology

## 2021-01-20 DIAGNOSIS — E282 Polycystic ovarian syndrome: Secondary | ICD-10-CM

## 2021-01-20 DIAGNOSIS — R7989 Other specified abnormal findings of blood chemistry: Secondary | ICD-10-CM

## 2021-01-20 DIAGNOSIS — L680 Hirsutism: Secondary | ICD-10-CM

## 2021-01-20 HISTORY — DX: Polycystic ovarian syndrome: E28.2

## 2021-01-20 LAB — ACTH: ACTH: 39.9 pg/mL (ref 7.2–63.3)

## 2021-01-20 LAB — CORTISOL: Cortisol: 16.4 ug/dL

## 2021-01-21 DIAGNOSIS — M4607 Spinal enthesopathy, lumbosacral region: Secondary | ICD-10-CM | POA: Diagnosis not present

## 2021-01-21 DIAGNOSIS — M62830 Muscle spasm of back: Secondary | ICD-10-CM | POA: Diagnosis not present

## 2021-01-21 DIAGNOSIS — M5127 Other intervertebral disc displacement, lumbosacral region: Secondary | ICD-10-CM | POA: Diagnosis not present

## 2021-01-21 DIAGNOSIS — M9903 Segmental and somatic dysfunction of lumbar region: Secondary | ICD-10-CM | POA: Diagnosis not present

## 2021-01-25 DIAGNOSIS — M4607 Spinal enthesopathy, lumbosacral region: Secondary | ICD-10-CM | POA: Diagnosis not present

## 2021-01-25 DIAGNOSIS — M9903 Segmental and somatic dysfunction of lumbar region: Secondary | ICD-10-CM | POA: Diagnosis not present

## 2021-01-25 DIAGNOSIS — M5127 Other intervertebral disc displacement, lumbosacral region: Secondary | ICD-10-CM | POA: Diagnosis not present

## 2021-01-25 DIAGNOSIS — M62830 Muscle spasm of back: Secondary | ICD-10-CM | POA: Diagnosis not present

## 2021-01-27 DIAGNOSIS — R002 Palpitations: Secondary | ICD-10-CM | POA: Diagnosis not present

## 2021-01-27 DIAGNOSIS — I471 Supraventricular tachycardia: Secondary | ICD-10-CM | POA: Diagnosis not present

## 2021-01-27 DIAGNOSIS — Z808 Family history of malignant neoplasm of other organs or systems: Secondary | ICD-10-CM | POA: Diagnosis not present

## 2021-01-28 DIAGNOSIS — M5127 Other intervertebral disc displacement, lumbosacral region: Secondary | ICD-10-CM | POA: Diagnosis not present

## 2021-01-28 DIAGNOSIS — M4607 Spinal enthesopathy, lumbosacral region: Secondary | ICD-10-CM | POA: Diagnosis not present

## 2021-01-28 DIAGNOSIS — M62830 Muscle spasm of back: Secondary | ICD-10-CM | POA: Diagnosis not present

## 2021-01-28 DIAGNOSIS — M9903 Segmental and somatic dysfunction of lumbar region: Secondary | ICD-10-CM | POA: Diagnosis not present

## 2021-02-08 DIAGNOSIS — M62830 Muscle spasm of back: Secondary | ICD-10-CM | POA: Diagnosis not present

## 2021-02-08 DIAGNOSIS — M4607 Spinal enthesopathy, lumbosacral region: Secondary | ICD-10-CM | POA: Diagnosis not present

## 2021-02-08 DIAGNOSIS — M9903 Segmental and somatic dysfunction of lumbar region: Secondary | ICD-10-CM | POA: Diagnosis not present

## 2021-02-08 DIAGNOSIS — M5127 Other intervertebral disc displacement, lumbosacral region: Secondary | ICD-10-CM | POA: Diagnosis not present

## 2021-02-17 ENCOUNTER — Other Ambulatory Visit: Payer: Self-pay

## 2021-02-17 ENCOUNTER — Ambulatory Visit (INDEPENDENT_AMBULATORY_CARE_PROVIDER_SITE_OTHER): Payer: BC Managed Care – PPO | Admitting: Dermatology

## 2021-02-17 DIAGNOSIS — L578 Other skin changes due to chronic exposure to nonionizing radiation: Secondary | ICD-10-CM

## 2021-02-17 DIAGNOSIS — K130 Diseases of lips: Secondary | ICD-10-CM

## 2021-02-17 DIAGNOSIS — D1800 Hemangioma unspecified site: Secondary | ICD-10-CM

## 2021-02-17 DIAGNOSIS — L720 Epidermal cyst: Secondary | ICD-10-CM

## 2021-02-17 DIAGNOSIS — L814 Other melanin hyperpigmentation: Secondary | ICD-10-CM

## 2021-02-17 DIAGNOSIS — D229 Melanocytic nevi, unspecified: Secondary | ICD-10-CM

## 2021-02-17 DIAGNOSIS — Z1283 Encounter for screening for malignant neoplasm of skin: Secondary | ICD-10-CM | POA: Diagnosis not present

## 2021-02-17 DIAGNOSIS — L700 Acne vulgaris: Secondary | ICD-10-CM

## 2021-02-17 DIAGNOSIS — L821 Other seborrheic keratosis: Secondary | ICD-10-CM

## 2021-02-17 MED ORDER — MUPIROCIN 2 % EX OINT: 1.0000 "application " | TOPICAL_OINTMENT | Freq: Two times a day (BID) | CUTANEOUS | 0 refills | Status: DC

## 2021-02-17 MED ORDER — KETOCONAZOLE 2 % EX CREA: 1.0000 "application " | TOPICAL_CREAM | Freq: Two times a day (BID) | CUTANEOUS | 1 refills | Status: AC

## 2021-02-17 MED ORDER — HYDROCORTISONE 2.5 % EX CREA: TOPICAL_CREAM | Freq: Two times a day (BID) | CUTANEOUS | 1 refills | Status: DC | PRN

## 2021-02-17 MED ORDER — TRETINOIN 0.025 % EX CREA: TOPICAL_CREAM | Freq: Every day | CUTANEOUS | 2 refills | Status: AC

## 2021-02-17 NOTE — Progress Notes (Signed)
Follow-Up Visit   Subjective  Tiffany Watkins is a 30 y.o. female who presents for the following: FBSE (Patient here for full body skin exam and skin cancer screening. Patient with fhx skin cancer. She would like to talk about a retinoid and have a mole removed at forehead. Patient also has some yeast at corners of mouth that she is treating with vagisil but does not seem to be helping. ).    The following portions of the chart were reviewed this encounter and updated as appropriate:   Tobacco  Allergies  Meds  Problems  Med Hx  Surg Hx  Fam Hx      Review of Systems:  No other skin or systemic complaints except as noted in HPI or Assessment and Plan.  Objective  Well appearing patient in no apparent distress; mood and affect are within normal limits.  A full examination was performed including scalp, head, eyes, ears, nose, lips, neck, chest, axillae, abdomen, back, buttocks, bilateral upper extremities, bilateral lower extremities, hands, feet, fingers, toes, fingernails, and toenails. All findings within normal limits unless otherwise noted below.  Objective  Face: 1+ open comedones  Objective  Lips: Erythematous patches at angles of mouth   Objective  Right Upper Back: Subcutaneous nodule.    Assessment & Plan  Acne vulgaris Face  Chronic condition with duration or expected duration over one year. Condition is bothersome to patient. Currently flared.  Start tretinoin 0.025% cream QHS as directed.   Topical retinoid medications like tretinoin/Retin-A, adapalene/Differin, tazarotene/Fabior, and Epiduo/Epiduo Forte can cause dryness and irritation when first started. Only apply a pea-sized amount to the entire affected area. Avoid applying it around the eyes, edges of mouth and creases at the nose. If you experience irritation, use a good moisturizer first and/or apply the medicine less often. If you are doing well with the medicine, you can increase how often you use  it until you are applying every night. Be careful with sun protection while using this medication as it can make you sensitive to the sun. This medicine should not be used by pregnant women.    Ordered Medications: tretinoin (RETIN-A) 0.025 % cream  Cheilitis Lips  Start ketoconazole 2% cream BID Start mupirocin BID Start HC 2.5% cream BID for up to 2 weeks. Avoid applying to face, groin, and axilla. Use as directed. Risk of skin atrophy with long-term use reviewed.    Ordered Medications: ketoconazole (NIZORAL) 2 % cream mupirocin ointment (BACTROBAN) 2 % hydrocortisone 2.5 % cream  Epidermal inclusion cyst Right Upper Back  Benign-appearing. Exam most consistent with an epidermal inclusion cyst. Discussed that a cyst is a benign growth that can grow over time and sometimes get irritated or inflamed. Recommend observation if it is not bothersome. Discussed option of surgical excision to remove it if it is growing, symptomatic, or other changes noted. Please call for new or changing lesions so they can be evaluated.     Lentigines - Scattered tan macules - Due to sun exposure - Benign-appering, observe - Recommend daily broad spectrum sunscreen SPF 30+ to sun-exposed areas, reapply every 2 hours as needed. - Call for any changes  Seborrheic Keratoses - Stuck-on, waxy, tan-brown papules and/or plaques  - Benign-appearing - Discussed benign etiology and prognosis. - Observe - Call for any changes  Melanocytic Nevi - Tan-brown and/or pink-flesh-colored symmetric macules and papules - Benign appearing on exam today - Observation - Call clinic for new or changing moles - Recommend daily use of  broad spectrum spf 30+ sunscreen to sun-exposed areas.   Hemangiomas - Red papules - Discussed benign nature - Observe - Call for any changes  Actinic Damage - Chronic condition, secondary to cumulative UV/sun exposure - diffuse scaly erythematous macules with underlying  dyspigmentation - Recommend daily broad spectrum sunscreen SPF 30+ to sun-exposed areas, reapply every 2 hours as needed.  - Staying in the shade or wearing long sleeves, sun glasses (UVA+UVB protection) and wide brim hats (4-inch brim around the entire circumference of the hat) are also recommended for sun protection.  - Call for new or changing lesions.  Skin cancer screening performed today.   Return in about 3 months (around 05/19/2021) for Acne.  Graciella Belton, RMA, am acting as scribe for Forest Gleason, MD .   Documentation: I have reviewed the above documentation for accuracy and completeness, and I agree with the above.  Forest Gleason, MD

## 2021-02-17 NOTE — Patient Instructions (Addendum)
Melanoma ABCDEs  Melanoma is the most dangerous type of skin cancer, and is the leading cause of death from skin disease.  You are more likely to develop melanoma if you:  Have light-colored skin, light-colored eyes, or red or blond hair  Spend a lot of time in the sun  Tan regularly, either outdoors or in a tanning bed  Have had blistering sunburns, especially during childhood  Have a close family member who has had a melanoma  Have atypical moles or large birthmarks  Early detection of melanoma is key since treatment is typically straightforward and cure rates are extremely high if we catch it early.   The first sign of melanoma is often a change in a mole or a new dark spot.  The ABCDE system is a way of remembering the signs of melanoma.  A for asymmetry:  The two halves do not match. B for border:  The edges of the growth are irregular. C for color:  A mixture of colors are present instead of an even brown color. D for diameter:  Melanomas are usually (but not always) greater than 2mm - the size of a pencil eraser. E for evolution:  The spot keeps changing in size, shape, and color.  Please check your skin once per month between visits. You can use a small mirror in front and a large mirror behind you to keep an eye on the back side or your body.   If you see any new or changing lesions before your next follow-up, please call to schedule a visit.  Please continue daily skin protection including broad spectrum sunscreen SPF 30+ to sun-exposed areas, reapplying every 2 hours as needed when you're outdoors.    Topical retinoid medications like tretinoin/Retin-A, adapalene/Differin, tazarotene/Fabior, and Epiduo/Epiduo Forte can cause dryness and irritation when first started. Only apply a pea-sized amount to the entire affected area. Avoid applying it around the eyes, edges of mouth and creases at the nose. If you experience irritation, use a good moisturizer first and/or apply the  medicine less often. If you are doing well with the medicine, you can increase how often you use it until you are applying every night. Be careful with sun protection while using this medication as it can make you sensitive to the sun. This medicine should not be used by pregnant women.    Recommend taking Heliocare sun protection supplement daily in sunny weather for additional sun protection. For maximum protection on the sunniest days, you can take up to 2 capsules of regular Heliocare OR take 1 capsule of Heliocare Ultra. For prolonged exposure (such as a full day in the sun), you can repeat your dose of the supplement 4 hours after your first dose. Heliocare can be purchased at [facility] or at [email or web address].    If you have any questions or concerns for your doctor, please call our main line at [phone] and press option 4 to reach your doctor's medical assistant. If no one answers, please leave a voicemail as directed and we will return your call as soon as possible. Messages left after 4 pm will be answered the following business day.   You may also send [place] a message via [place]. We typically respond to MyChart messages within 1-2 business days.  For prescription refills, please ask your pharmacy to contact our office. Our fax number is [phone].  If you have an urgent issue when the clinic is closed that cannot wait until the next business day,  you can page your doctor at the number below.    Please note that while we do our best to be available for urgent issues outside of office hours, we are not available 24/7.   If you have an urgent issue and are unable to reach [place], you may choose to seek medical care at your doctor's office, retail clinic, urgent care center, or emergency room.  If you have a medical emergency, please immediately call 911 or go to the emergency department.  Pager Numbers  - Dr. Nehemiah Massed: [phone]  - Dr. Laurence Ferrari: [phone]  - Dr.  Nicole Kindred: [phone]  In the event of inclement weather, please call our main line at [phone] for an update on the status of any delays or closures.  Dermatology Medication Tips: Please keep the boxes that topical medications come in in order to help keep track of the instructions about where and how to use these. Pharmacies typically print the medication instructions only on the boxes and not directly on the medication tubes.   If your medication is too expensive, please contact our office at [phone] option 4 or send [place] a message through [place].   We are unable to tell what your co-pay for medications will be in advance as this is different depending on your insurance coverage. However, we may be able to find a substitute medication at lower cost or fill out paperwork to get insurance to cover a needed medication.   If a prior authorization is required to get your medication covered by your insurance company, please allow [place] 1-2 business days to complete this process.  Drug prices often vary depending on where the prescription is filled and some pharmacies may offer cheaper prices.  The website www.goodrx.com contains coupons for medications through different pharmacies. The prices here do not account for what the cost may be with help from insurance (it may be cheaper with your insurance), but the website can give you the price if you did not use any insurance.  - You can print the associated coupon and take it with your prescription to the pharmacy.  - You may also stop by our office during regular business hours and pick up a GoodRx coupon card.  - If you need your prescription sent electronically to a different pharmacy, notify our office through [facility] or by phone at [phone] option 4.

## 2021-02-18 DIAGNOSIS — R7989 Other specified abnormal findings of blood chemistry: Secondary | ICD-10-CM | POA: Diagnosis not present

## 2021-02-18 DIAGNOSIS — E282 Polycystic ovarian syndrome: Secondary | ICD-10-CM | POA: Diagnosis not present

## 2021-02-23 ENCOUNTER — Encounter: Payer: Self-pay | Admitting: Dermatology

## 2021-03-02 DIAGNOSIS — E282 Polycystic ovarian syndrome: Secondary | ICD-10-CM | POA: Diagnosis not present

## 2021-03-02 DIAGNOSIS — R7989 Other specified abnormal findings of blood chemistry: Secondary | ICD-10-CM | POA: Diagnosis not present

## 2021-04-14 DIAGNOSIS — E282 Polycystic ovarian syndrome: Secondary | ICD-10-CM | POA: Diagnosis not present

## 2021-04-14 DIAGNOSIS — Z13220 Encounter for screening for lipoid disorders: Secondary | ICD-10-CM | POA: Diagnosis not present

## 2021-04-14 DIAGNOSIS — R718 Other abnormality of red blood cells: Secondary | ICD-10-CM | POA: Diagnosis not present

## 2021-04-14 DIAGNOSIS — Z0000 Encounter for general adult medical examination without abnormal findings: Secondary | ICD-10-CM | POA: Diagnosis not present

## 2021-06-02 ENCOUNTER — Ambulatory Visit: Payer: BC Managed Care – PPO | Admitting: Dermatology

## 2021-07-20 DIAGNOSIS — E282 Polycystic ovarian syndrome: Secondary | ICD-10-CM | POA: Diagnosis not present

## 2021-07-20 DIAGNOSIS — R7989 Other specified abnormal findings of blood chemistry: Secondary | ICD-10-CM | POA: Diagnosis not present

## 2021-07-27 DIAGNOSIS — Z6841 Body Mass Index (BMI) 40.0 and over, adult: Secondary | ICD-10-CM | POA: Diagnosis not present

## 2021-07-27 DIAGNOSIS — E282 Polycystic ovarian syndrome: Secondary | ICD-10-CM | POA: Diagnosis not present

## 2021-08-11 DIAGNOSIS — E611 Iron deficiency: Secondary | ICD-10-CM | POA: Diagnosis not present

## 2021-08-11 DIAGNOSIS — R748 Abnormal levels of other serum enzymes: Secondary | ICD-10-CM | POA: Diagnosis not present

## 2021-10-18 DIAGNOSIS — E282 Polycystic ovarian syndrome: Secondary | ICD-10-CM | POA: Diagnosis not present

## 2021-10-18 DIAGNOSIS — Z6841 Body Mass Index (BMI) 40.0 and over, adult: Secondary | ICD-10-CM | POA: Diagnosis not present

## 2021-11-23 ENCOUNTER — Telehealth: Payer: Self-pay

## 2021-11-23 NOTE — Telephone Encounter (Signed)
Patient is scheduled for 01/13/22 at 8:55 with ABC for [facility] replacement

## 2021-11-25 NOTE — Telephone Encounter (Signed)
Noted. Will order to arrive by apt date/time. 

## 2022-01-04 NOTE — Progress Notes (Signed)
PCP:  Patient, No Pcp Per (Inactive)   Chief Complaint  Patient presents with   Gynecologic Exam     HPI:      Ms. Tiffany Watkins is a 31 y.o. No obstetric history on file. who LMP was No LMP recorded. (Menstrual status: IUD)., presents today for her  annual examination.  Her menses were infrequent, light bleeding with IUD, lasting a few days, no BTB, no dysmen. Periods have been monthly recently, lasting 4-5 days with light to mod flow, no BTB, mild dysmen, no meds needed. IUD due for replacement 4/23.  Sex activity: currently sexually active. Contraception--Kyleena placed 02/2017 (pt found IUD card; originally thought ~6/19). Would like replacement.  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 Georgianne Fick; repeat pap due today.  Hx of STDs: Hx of HPV on pap in 2011/2012. 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   There is no FH of breast cancer. There is no FH of ovarian cancer. The patient does do self-breast exams. Mom with metastatic melanoma. Pt sees derm for mole checks.   Tobacco use: The patient denies current or previous tobacco use. Alcohol use: social drinker No drug use.  Exercise: mod active  She does get adequate calcium but not enough Vitamin D in her diet.  She takes ativan sparingly for situational anxiety. 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   PCOS (polycystic ovarian syndrome) 01/20/2021   SVT (supraventricular tachycardia) (HCC)      Past Surgical History:  Procedure Laterality Date   WISDOM TOOTH EXTRACTION      Family History  Problem Relation Age of Onset   Hypertension Mother    Melanoma Mother        mets to lungs   Hypertension Father    Breast cancer Neg Hx    Ovarian cancer Neg Hx     Social  History   Socioeconomic History   Marital status: Single    Spouse name: Not on file   Number of children: Not on file   Years of education: Not on file   Highest education level: Not on file  Occupational History   Not on file  Tobacco Use   Smoking status: Former   Smokeless tobacco: Never  Vaping Use   Vaping Use: Former  Substance and Sexual Activity   Alcohol use: Yes   Drug use: Never   Sexual activity: Yes    Birth control/protection: I.U.D.    Comment: Kyleena  Other Topics Concern   Not on file  Social History Narrative   Not on file   Social Determinants of Health   Financial Resource Strain: Not on file  Food Insecurity: Not on file  Transportation Needs: Not on file  Physical Activity: Not on file  Stress: Not on file  Social Connections: Not on file  Intimate Partner Violence: Not on file     Current Outpatient Medications:    buPROPion (WELLBUTRIN XL) 150 MG 24 hr tablet, Take 1 tablet by mouth daily., Disp: , Rfl:    hydrocortisone 2.5 % cream, Apply topically 2 (two) times daily as needed (Rash). For up to 2 weeks to corners of mouth, Disp: 28  g, Rfl: 1   LORazepam (ATIVAN) 0.5 MG tablet, Take 1 tablet (0.5 mg total) by mouth every 8 (eight) hours as needed for anxiety., Disp: 30 tablet, Rfl: 0   mupirocin ointment (BACTROBAN) 2 %, Apply 1 application topically 2 (two) times daily. To corner of mouth, Disp: 22 g, Rfl: 0   tretinoin (RETIN-A) 0.025 % cream, Apply topically at bedtime. As directed, Disp: 45 g, Rfl: 2   levonorgestrel (KYLEENA) 19.5 MG IUD, 1 each by Intrauterine route once for 1 dose., Disp: 1 each, Rfl: 0     ROS:  Review of Systems  Constitutional:  Negative for fatigue, fever and unexpected weight change.  Respiratory:  Negative for cough, shortness of breath and wheezing.   Cardiovascular:  Negative for chest pain, palpitations and leg swelling.  Gastrointestinal:  Negative for blood in stool, constipation, diarrhea, nausea and  vomiting.  Endocrine: Negative for cold intolerance, heat intolerance and polyuria.  Genitourinary:  Negative for dyspareunia, dysuria, flank pain, frequency, genital sores, hematuria, menstrual problem, pelvic pain, urgency, vaginal bleeding, vaginal discharge and vaginal pain.  Musculoskeletal:  Negative for back pain, joint swelling and myalgias.  Skin:  Negative for rash.  Neurological:  Negative for dizziness, syncope, light-headedness, numbness and headaches.  Hematological:  Negative for adenopathy.  Psychiatric/Behavioral:  Negative for agitation, confusion, sleep disturbance and suicidal ideas. The patient is not nervous/anxious.  BREAST: No symptoms   Objective: BP 118/60    Ht 5\' 6"  (1.676 m)    Wt 242 lb (109.8 kg)    BMI 39.06 kg/m    Physical Exam Constitutional:      Appearance: She is well-developed.  Genitourinary:     Vulva normal.     Right Labia: No rash, tenderness or lesions.    Left Labia: No tenderness, lesions or rash.    No vaginal discharge, erythema or tenderness.      Right Adnexa: not tender and no mass present.    Left Adnexa: not tender and no mass present.    No cervical friability or polyp.     IUD strings visualized.     Uterus is not enlarged or tender.  Breasts:    Right: No mass, nipple discharge, skin change or tenderness.     Left: No mass, nipple discharge, skin change or tenderness.  Neck:     Thyroid: No thyromegaly.  Cardiovascular:     Rate and Rhythm: Normal rate and regular rhythm.     Heart sounds: Normal heart sounds. No murmur heard. Pulmonary:     Effort: Pulmonary effort is normal.     Breath sounds: Normal breath sounds.  Abdominal:     Palpations: Abdomen is soft.     Tenderness: There is no abdominal tenderness. There is no guarding or rebound.  Musculoskeletal:        General: Normal range of motion.     Cervical back: Normal range of motion.  Lymphadenopathy:     Cervical: No cervical adenopathy.  Neurological:      General: No focal deficit present.     Mental Status: She is alert and oriented to person, place, and time.     Cranial Nerves: No cranial nerve deficit.  Skin:    General: Skin is warm and dry.  Psychiatric:        Mood and Affect: Mood normal.        Behavior: Behavior normal.        Thought Content: Thought content normal.  Judgment: Judgment normal.  Vitals reviewed.   IUD Removal Strings of IUD identified and grasped.  IUD removed without problem with ring forceps.  Pt tolerated this well.  IUD noted to be intact.  IUD Insertion Procedure Note Patient identified, informed consent performed, consent signed.   Discussed risks of irregular bleeding, cramping, infection, malpositioning or misplacement of the IUD outside the uterus which may require further procedure such as laparoscopy, risk of failure <1%. Time out was performed.    Speculum placed in the vagina.  Cervix visualized.  Cleaned with Betadine x 2.  Grasped anteriorly with a single tooth tenaculum.  Uterus sounded to 6.5 cm.   IUD placed per manufacturer's recommendations.  Strings trimmed to 3 cm. Tenaculum was removed, good hemostasis noted.  Patient tolerated procedure well.   ASSESSMENT:  Encounter for annual routine gynecological examination  Cervical cancer screening - Plan: Cytology - PAP  Screening for HPV (human papillomavirus) - Plan: Cytology - PAP  LGSIL on Pap smear of cervix - Plan: Cytology - PAP; repeat pap today. Will f/u with results.   Encounter for removal and reinsertion of intrauterine contraceptive device (IUD) - Plan: levonorgestrel (KYLEENA) 19.5 MG IUD; IUD due for removal 4/23, pt would like replacement and wants to do today    Meds ordered this encounter  Medications   levonorgestrel (KYLEENA) 19.5 MG IUD    Sig: 1 each by Intrauterine route once for 1 dose.    Dispense:  1 each    Refill:  0    Order Specific Question:   Supervising Provider    Answer:   Gae Dry  [[id number]]     Plan:  Patient was given post-procedure instructions.  She was advised to have backup contraception for one week.   Call if you are having increasing pain, cramps or bleeding or if you have a fever greater than 100.4 degrees F., shaking chills, nausea or vomiting. Patient was also asked to check IUD strings periodically and follow up in 4 weeks for IUD check.  GYN counsel adequate intake of calcium and vitamin D, diet and exercise     F/U  Return in about 4 weeks (around 3/33/8329) for IUD f/u  Laranda Burkemper B. Derhonda Eastlick, PA-C 01/05/2022 10:57 AM

## 2022-01-05 ENCOUNTER — Ambulatory Visit (INDEPENDENT_AMBULATORY_CARE_PROVIDER_SITE_OTHER): Payer: BC Managed Care – PPO | Admitting: Obstetrics and Gynecology

## 2022-01-05 ENCOUNTER — Encounter: Payer: Self-pay | Admitting: Obstetrics and Gynecology

## 2022-01-05 ENCOUNTER — Other Ambulatory Visit: Payer: Self-pay

## 2022-01-05 ENCOUNTER — Other Ambulatory Visit (HOSPITAL_COMMUNITY)
Admission: RE | Admit: 2022-01-05 | Discharge: 2022-01-05 | Disposition: A | Discharge status: Home / Self Care | Payer: BC Managed Care – PPO | Source: Ambulatory Visit | Attending: Obstetrics and Gynecology | Admitting: Obstetrics and Gynecology

## 2022-01-05 VITALS — BP 118/60 | Ht 66.0 in | Wt 242.0 lb

## 2022-01-05 DIAGNOSIS — Z124 Encounter for screening for malignant neoplasm of cervix: Secondary | ICD-10-CM | POA: Insufficient documentation

## 2022-01-05 DIAGNOSIS — R87612 Low grade squamous intraepithelial lesion on cytologic smear of cervix (LGSIL): Secondary | ICD-10-CM | POA: Diagnosis not present

## 2022-01-05 DIAGNOSIS — Z1151 Encounter for screening for human papillomavirus (HPV): Secondary | ICD-10-CM | POA: Diagnosis not present

## 2022-01-05 DIAGNOSIS — Z01419 Encounter for gynecological examination (general) (routine) without abnormal findings: Secondary | ICD-10-CM

## 2022-01-05 DIAGNOSIS — Z30433 Encounter for removal and reinsertion of intrauterine contraceptive device: Secondary | ICD-10-CM

## 2022-01-05 DIAGNOSIS — Z30431 Encounter for routine checking of intrauterine contraceptive device: Secondary | ICD-10-CM

## 2022-01-05 MED ORDER — LEVONORGESTREL 19.5 MG IU IUD: 1.0000 | INTRAUTERINE_SYSTEM | Freq: Once | INTRAUTERINE | 0 refills | Status: DC

## 2022-01-07 LAB — CYTOLOGY - PAP
Comment: NEGATIVE
High risk HPV: NEGATIVE

## 2022-01-11 NOTE — Telephone Encounter (Signed)
Patient r/s apt to 01/05/22. Kyleena rcvd/charged 01/05/22 ?

## 2022-01-12 DIAGNOSIS — R748 Abnormal levels of other serum enzymes: Secondary | ICD-10-CM | POA: Diagnosis not present

## 2022-01-12 DIAGNOSIS — E611 Iron deficiency: Secondary | ICD-10-CM | POA: Diagnosis not present

## 2022-01-13 ENCOUNTER — Ambulatory Visit: Payer: BC Managed Care – PPO | Admitting: Obstetrics and Gynecology

## 2022-01-17 DIAGNOSIS — E282 Polycystic ovarian syndrome: Secondary | ICD-10-CM | POA: Diagnosis not present

## 2022-01-24 DIAGNOSIS — Z6841 Body Mass Index (BMI) 40.0 and over, adult: Secondary | ICD-10-CM | POA: Diagnosis not present

## 2022-01-24 DIAGNOSIS — E282 Polycystic ovarian syndrome: Secondary | ICD-10-CM | POA: Diagnosis not present

## 2022-01-26 DIAGNOSIS — Z7689 Persons encountering health services in other specified circumstances: Secondary | ICD-10-CM | POA: Diagnosis not present

## 2022-01-26 DIAGNOSIS — R200 Anesthesia of skin: Secondary | ICD-10-CM | POA: Diagnosis not present

## 2022-01-26 DIAGNOSIS — R519 Headache, unspecified: Secondary | ICD-10-CM | POA: Diagnosis not present

## 2022-01-26 DIAGNOSIS — R202 Paresthesia of skin: Secondary | ICD-10-CM | POA: Diagnosis not present

## 2022-01-31 ENCOUNTER — Other Ambulatory Visit: Payer: Self-pay | Admitting: Physician Assistant

## 2022-01-31 ENCOUNTER — Other Ambulatory Visit (HOSPITAL_COMMUNITY): Payer: Self-pay | Admitting: Physician Assistant

## 2022-02-01 ENCOUNTER — Other Ambulatory Visit: Payer: Self-pay | Admitting: Physician Assistant

## 2022-02-01 DIAGNOSIS — R202 Paresthesia of skin: Secondary | ICD-10-CM

## 2022-02-01 DIAGNOSIS — G379 Demyelinating disease of central nervous system, unspecified: Secondary | ICD-10-CM

## 2022-02-01 DIAGNOSIS — R519 Headache, unspecified: Secondary | ICD-10-CM

## 2022-02-02 NOTE — Progress Notes (Signed)
? ?  Chief Complaint  ?Patient presents with  ? IUD check  ?  No concerns  ? ? ? ?History of Present Illness:  Yanin Muhlestein is a 31 y.o. that had a  [place]  IUD REplaced approximately 1 month ago. Since that time, she denies dyspareunia, pelvic pain, vaginal d/c, heavy bleeding. Started spotting yesterday, no bleeding today. Doing well.  ? ?Review of Systems  ?Constitutional:  Negative for fever.  ?Gastrointestinal:  Negative for blood in stool, constipation, diarrhea, nausea and vomiting.  ?Genitourinary:  Negative for dyspareunia, dysuria, flank pain, frequency, hematuria, urgency, vaginal bleeding, vaginal discharge and vaginal pain.  ?Musculoskeletal:  Negative for back pain.  ?Skin:  Negative for rash.  ? ?Physical Exam:  ?BP 100/70   Ht '5\' 6"'$  (1.676 m)   Wt 240 lb (108.9 kg)   BMI 38.74 kg/m?  Body mass index is 38.74 kg/m?. ? ?Pelvic exam:  ?Two IUD strings present seen coming from the cervical os. ?EGBUS, vaginal vault and cervix: within normal limits ? ? ?Assessment:  ? ?Encounter for routine checking of intrauterine contraceptive device (IUD) ? ?IUD strings present in proper location; pt doing well ? ?Plan: ?F/u if any signs of infection or can no longer feel the strings.  ? ?Niya Behler B. Ivery Nanney, PA-C ?02/03/2022 ?9:38 AM ? ?

## 2022-02-03 ENCOUNTER — Other Ambulatory Visit: Payer: Self-pay | Admitting: Physician Assistant

## 2022-02-03 ENCOUNTER — Encounter: Payer: Self-pay | Admitting: Obstetrics and Gynecology

## 2022-02-03 ENCOUNTER — Ambulatory Visit (INDEPENDENT_AMBULATORY_CARE_PROVIDER_SITE_OTHER): Payer: BC Managed Care – PPO | Admitting: Obstetrics and Gynecology

## 2022-02-03 VITALS — BP 100/70 | Ht 66.0 in | Wt 240.0 lb

## 2022-02-03 DIAGNOSIS — R200 Anesthesia of skin: Secondary | ICD-10-CM

## 2022-02-03 DIAGNOSIS — Z30431 Encounter for routine checking of intrauterine contraceptive device: Secondary | ICD-10-CM | POA: Diagnosis not present

## 2022-02-03 DIAGNOSIS — R519 Headache, unspecified: Secondary | ICD-10-CM

## 2022-02-03 DIAGNOSIS — G379 Demyelinating disease of central nervous system, unspecified: Secondary | ICD-10-CM

## 2022-02-03 NOTE — Patient Instructions (Signed)
I value your feedback and you entrusting us with your care. If you get a [facility] patient survey, I would appreciate you taking the time to let us know about your experience today. Thank you! ? ? ?

## 2022-02-10 ENCOUNTER — Ambulatory Visit: Payer: BC Managed Care – PPO

## 2022-02-10 ENCOUNTER — Other Ambulatory Visit: Payer: BC Managed Care – PPO

## 2022-02-15 ENCOUNTER — Ambulatory Visit
Admission: RE | Admit: 2022-02-15 | Discharge: 2022-02-15 | Disposition: A | Discharge status: Home / Self Care | Payer: BC Managed Care – PPO | Source: Ambulatory Visit | Attending: Physician Assistant | Admitting: Physician Assistant

## 2022-02-15 DIAGNOSIS — R202 Paresthesia of skin: Secondary | ICD-10-CM

## 2022-02-15 DIAGNOSIS — R519 Headache, unspecified: Secondary | ICD-10-CM | POA: Diagnosis not present

## 2022-02-15 DIAGNOSIS — G379 Demyelinating disease of central nervous system, unspecified: Secondary | ICD-10-CM | POA: Diagnosis not present

## 2022-02-15 DIAGNOSIS — R200 Anesthesia of skin: Secondary | ICD-10-CM | POA: Diagnosis not present

## 2022-02-15 MED ORDER — GADOBENATE DIMEGLUMINE 529 MG/ML IV SOLN
20.0000 mL | Freq: Once | INTRAVENOUS | Status: AC | PRN
  Administered 2022-02-15: 20 mL via INTRAVENOUS

## 2022-07-28 ENCOUNTER — Encounter: Payer: Self-pay | Admitting: Dermatology

## 2022-07-28 ENCOUNTER — Ambulatory Visit (INDEPENDENT_AMBULATORY_CARE_PROVIDER_SITE_OTHER): Payer: BC Managed Care – PPO | Admitting: Dermatology

## 2022-07-28 DIAGNOSIS — L720 Epidermal cyst: Secondary | ICD-10-CM

## 2022-07-28 DIAGNOSIS — D229 Melanocytic nevi, unspecified: Secondary | ICD-10-CM

## 2022-07-28 DIAGNOSIS — Z808 Family history of malignant neoplasm of other organs or systems: Secondary | ICD-10-CM

## 2022-07-28 DIAGNOSIS — L821 Other seborrheic keratosis: Secondary | ICD-10-CM

## 2022-07-28 DIAGNOSIS — L570 Actinic keratosis: Secondary | ICD-10-CM

## 2022-07-28 DIAGNOSIS — D239 Other benign neoplasm of skin, unspecified: Secondary | ICD-10-CM

## 2022-07-28 DIAGNOSIS — L814 Other melanin hyperpigmentation: Secondary | ICD-10-CM | POA: Diagnosis not present

## 2022-07-28 DIAGNOSIS — D1801 Hemangioma of skin and subcutaneous tissue: Secondary | ICD-10-CM

## 2022-07-28 DIAGNOSIS — D225 Melanocytic nevi of trunk: Secondary | ICD-10-CM

## 2022-07-28 DIAGNOSIS — D485 Neoplasm of uncertain behavior of skin: Secondary | ICD-10-CM

## 2022-07-28 DIAGNOSIS — L578 Other skin changes due to chronic exposure to nonionizing radiation: Secondary | ICD-10-CM | POA: Diagnosis not present

## 2022-07-28 DIAGNOSIS — Z1283 Encounter for screening for malignant neoplasm of skin: Secondary | ICD-10-CM

## 2022-07-28 HISTORY — DX: Other benign neoplasm of skin, unspecified: D23.9

## 2022-07-28 MED ORDER — MUPIROCIN 2 % EX OINT: TOPICAL_OINTMENT | CUTANEOUS | 0 refills | Status: DC

## 2022-07-28 NOTE — Progress Notes (Signed)
Follow-Up Visit   Subjective  Tiffany Watkins is a 31 y.o. female who presents for the following: Annual Exam (Would like to discuss cyst excision on the back. Has been present about 6-7 years with hx of rupture and drainage).   The patient presents for Total-Body Skin Exam (TBSE) for skin cancer screening and mole check.  The patient has spots, moles and lesions to be evaluated, some may be new or changing.   The following portions of the chart were reviewed this encounter and updated as appropriate:   Tobacco  Allergies  Meds  Problems  Med Hx  Surg Hx  Fam Hx      Review of Systems:  No other skin or systemic complaints except as noted in HPI or Assessment and Plan.  Objective  Well appearing patient in no apparent distress; mood and affect are within normal limits.  A full examination was performed including scalp, head, eyes, ears, nose, lips, neck, chest, axillae, abdomen, back, buttocks, bilateral upper extremities, bilateral lower extremities, hands, feet, fingers, toes, fingernails, and toenails. All findings within normal limits unless otherwise noted below.  Mid Back Subcutaneous nodule   R lat forehead Erythematous thin papules/macules with gritty scale.   L med buttock 1.0 cm med to dark brown papule.    Assessment & Plan  Epidermal inclusion cyst Mid Back  Benign-appearing. Exam most consistent with an epidermal inclusion cyst. Discussed that a cyst is a benign growth that can grow over time and sometimes get irritated or inflamed. Recommend observation if it is not bothersome. Discussed option of surgical excision to remove it if it is growing, symptomatic, or other changes noted. Please call for new or changing lesions so they can be evaluated.  Discussed I&D 3-4 weeks before surgery to decrease the size of the excision site.     AK (actinic keratosis) R lat forehead  Actinic keratoses are precancerous spots that appear secondary to cumulative UV  radiation exposure/sun exposure over time. They are chronic with expected duration over 1 year. A portion of actinic keratoses will progress to squamous cell carcinoma of the skin. It is not possible to reliably predict which spots will progress to skin cancer and so treatment is recommended to prevent development of skin cancer.  Recommend daily broad spectrum sunscreen SPF 30+ to sun-exposed areas, reapply every 2 hours as needed.  Recommend staying in the shade or wearing long sleeves, sun glasses (UVA+UVB protection) and wide brim hats (4-inch brim around the entire circumference of the hat). Call for new or changing lesions.  Prior to procedure, discussed risks of blister formation, small wound, skin dyspigmentation, or rare scar following cryotherapy. Recommend Vaseline ointment to treated areas while healing.    Destruction of lesion - R lat forehead Complexity: simple   Destruction method: cryotherapy   Informed consent: discussed and consent obtained   Timeout:  patient name, date of birth, surgical site, and procedure verified Lesion destroyed using liquid nitrogen: Yes   Region frozen until ice ball extended beyond lesion: Yes   Outcome: patient tolerated procedure well with no complications   Post-procedure details: wound care instructions given    Neoplasm of uncertain behavior of skin L med buttock  Epidermal / dermal shaving  Lesion diameter (cm):  1 Informed consent: discussed and consent obtained   Timeout: patient name, date of birth, surgical site, and procedure verified   Procedure prep:  Patient was prepped and draped in usual sterile fashion Prep type:  Isopropyl alcohol Anesthesia:  the lesion was anesthetized in a standard fashion   Anesthetic:  1% lidocaine w/ epinephrine 1-100,000 buffered w/ 8.4% NaHCO3 Instrument used: flexible razor blade   Hemostasis achieved with: pressure, aluminum chloride and electrodesiccation   Outcome: patient tolerated procedure  well   Post-procedure details: sterile dressing applied and wound care instructions given   Dressing type: bandage and petrolatum    mupirocin ointment (BACTROBAN) 2 % Apply to wound QD until healed.  Specimen 1 - Surgical pathology Differential Diagnosis: D48.5 r/o dysplastic nevus Check Margins: No  Start Mupirocin 2% ointment to aa QD until healed.   Lentigines - Scattered tan macules - Due to sun exposure - Benign-appearing, observe - Recommend daily broad spectrum sunscreen SPF 30+ to sun-exposed areas, reapply every 2 hours as needed. - Call for any changes  Seborrheic Keratoses - Stuck-on, waxy, tan-brown papules and/or plaques  - Benign-appearing - Discussed benign etiology and prognosis. - Observe - Call for any changes  Melanocytic Nevi - Tan-brown and/or pink-flesh-colored symmetric macules and papules - Benign appearing on exam today - Observation - Call clinic for new or changing moles - Recommend daily use of broad spectrum spf 30+ sunscreen to sun-exposed areas.   Hemangiomas - Red papules - Discussed benign nature - Observe - Call for any changes  Actinic Damage - Chronic condition, secondary to cumulative UV/sun exposure - diffuse scaly erythematous macules with underlying dyspigmentation - Recommend daily broad spectrum sunscreen SPF 30+ to sun-exposed areas, reapply every 2 hours as needed.  - Staying in the shade or wearing long sleeves, sun glasses (UVA+UVB protection) and wide brim hats (4-inch brim around the entire circumference of the hat) are also recommended for sun protection.  - Call for new or changing lesions.  Family history of skin cancer - what type(s): Melanoma  - who affected: Mother  Skin cancer screening performed today.  Return in about 1 year (around 07/29/2023) for TBSE; schedule I&D first in late Oct/Nov then surgery 3-4 weeks later for cyst on the back.  Luther Redo, CMA, am acting as scribe for Forest Gleason, MD  .  Documentation: I have reviewed the above documentation for accuracy and completeness, and I agree with the above.  Forest Gleason, MD

## 2022-07-28 NOTE — Patient Instructions (Addendum)
Wound Care Instructions  Cleanse wound gently with soap and water once a day then pat dry with clean gauze. Apply a thin coat of Petrolatum (petroleum jelly, "Vaseline") over the wound (unless you have an allergy to this). We recommend that you use a new, sterile tube of Vaseline. Do not pick or remove scabs. Do not remove the yellow or white "healing tissue" from the base of the wound.  Cover the wound with fresh, clean, nonstick gauze and secure with paper tape. You may use Band-Aids in place of gauze and tape if the wound is small enough, but would recommend trimming much of the tape off as there is often too much. Sometimes Band-Aids can irritate the skin.  You should call the office for your biopsy report after 1 week if you have not already been contacted.  If you experience any problems, such as abnormal amounts of bleeding, swelling, significant bruising, significant pain, or evidence of infection, please call the office immediately.  FOR ADULT SURGERY PATIENTS: If you need something for pain relief you may take 1 extra strength Tylenol (acetaminophen) AND 2 Ibuprofen ('200mg'$  each) together every 4 hours as needed for pain. (do not take these if you are allergic to them or if you have a reason you should not take them.) Typically, you may only need pain medication for 1 to 3 days.        Pre-Operative Instructions  You are scheduled for a surgical procedure at [facility]. We recommend you read the following instructions. If you have any questions or concerns, please call the office at [phone].  Shower and wash the entire body with soap and water the day of your surgery paying special attention to cleansing at and around the planned surgery site.  Avoid aspirin or aspirin containing products at least fourteen (14) days prior to your surgical procedure and for at least one week (7 Days) after your surgical procedure. If you take aspirin on a regular basis for heart  disease or history of stroke or for any other reason, we may recommend you continue taking aspirin but please notify us if you take this on a regular basis. Aspirin can cause more bleeding to occur during surgery as well as prolonged bleeding and bruising after surgery.   Avoid other nonsteroidal pain medications at least one week prior to surgery and at least one week prior to your surgery. These include medications such as Ibuprofen (Motrin, Advil and Nuprin), Naprosyn, Voltaren, Relafen, etc. If medications are used for therapeutic reasons, please inform us as they can cause increased bleeding or prolonged bleeding during and bruising after surgical procedures.   Please advise [place] if you are taking any "blood thinner" medications such as Coumadin or Dipyridamole or Plavix or similar medications. These cause increased bleeding and prolonged bleeding during procedures and bruising after surgical procedures. We may have to consider discontinuing these medications briefly prior to and shortly after your surgery if safe to do so.   Please inform us of all medications you are currently taking. All medications that are taken regularly should be taken the day of surgery as you always do. Nevertheless, we need to be informed of what medications you are taking prior to surgery to know whether they will affect the procedure or cause any complications.   Please inform us of any medication allergies. Also inform us of whether you have allergies to Latex or rubber products or whether you have had any adverse reaction to Lidocaine or  Epinephrine.  Please inform us of any prosthetic or artificial body parts such as artificial heart valve, joint replacements, etc., or similar condition that might require preoperative antibiotics.   We recommend avoidance of alcohol at least two weeks prior to surgery and continued avoidance for at least two weeks after surgery.   We recommend discontinuation of tobacco smoking at  least two weeks prior to surgery and continued abstinence for at least two weeks after surgery.  Do not plan strenuous exercise, strenuous work or strenuous lifting for approximately four weeks after your surgery.   We request if you are unable to make your scheduled surgical appointment, please call us at least a week in advance or as soon as you are aware of a problem so that we can cancel or reschedule the appointment.   You MAY TAKE TYLENOL (acetaminophen) for pain as it is not a blood thinner.   PLEASE PLAN TO BE IN TOWN FOR TWO WEEKS FOLLOWING SURGERY, THIS IS IMPORTANT SO YOU CAN BE CHECKED FOR DRESSING CHANGES, SUTURE REMOVAL AND TO MONITOR FOR POSSIBLE COMPLICATIONS.   Recommend taking Heliocare sun protection supplement daily in sunny weather for additional sun protection. For maximum protection on the sunniest days, you can take up to 2 capsules of regular Heliocare OR take 1 capsule of Heliocare Ultra. For prolonged exposure (such as a full day in the sun), you can repeat your dose of the supplement 4 hours after your first dose. Heliocare can be purchased at [employer], at some [facility] or at [email or web address].    Due to recent changes in healthcare laws, you may see results of your pathology and/or laboratory studies on MyChart before the doctors have had a chance to review them. We understand that in some cases there may be results that are confusing or concerning to you. Please understand that not all results are received at the same time and often the doctors may need to interpret multiple results in order to provide you with the best plan of care or course of treatment. Therefore, we ask that you please give [place] 2 business days to thoroughly review all your results before contacting the office for clarification. Should we see a critical lab result, you will be contacted sooner.   If You Need Anything After Your Visit  If you have any questions or concerns for your  doctor, please call our main line at [phone] and press option 4 to reach your doctor's medical assistant. If no one answers, please leave a voicemail as directed and we will return your call as soon as possible. Messages left after 4 pm will be answered the following business day.   You may also send [place] a message via [place]. We typically respond to MyChart messages within 1-2 business days.  For prescription refills, please ask your pharmacy to contact our office. Our fax number is [phone].  If you have an urgent issue when the clinic is closed that cannot wait until the next business day, you can page your doctor at the number below.    Please note that while we do our best to be available for urgent issues outside of office hours, we are not available 24/7.   If you have an urgent issue and are unable to reach [place], you may choose to seek medical care at your doctor's office, retail clinic, urgent care center, or emergency room.  If you have a medical emergency, please immediately call 911 or go to the emergency department.  Pager Numbers  - Dr. Nehemiah Massed: [phone]  - Dr. Laurence Ferrari: [phone]  - Dr. Nicole Kindred: [phone]  In the event of inclement weather, please call our main line at [phone] for an update on the status of any delays or closures.  Dermatology Medication Tips: Please keep the boxes that topical medications come in in order to help keep track of the instructions about where and how to use these. Pharmacies typically print the medication instructions only on the boxes and not directly on the medication tubes.   If your medication is too expensive, please contact our office at [phone] option 4 or send [place] a message through [place].   We are unable to tell what your co-pay for medications will be in advance as this is different depending on your insurance coverage. However, we may be able to find a substitute medication at lower cost or fill out paperwork  to get insurance to cover a needed medication.   If a prior authorization is required to get your medication covered by your insurance company, please allow [place] 1-2 business days to complete this process.  Drug prices often vary depending on where the prescription is filled and some pharmacies may offer cheaper prices.  The website www.goodrx.com contains coupons for medications through different pharmacies. The prices here do not account for what the cost may be with help from insurance (it may be cheaper with your insurance), but the website can give you the price if you did not use any insurance.  - You can print the associated coupon and take it with your prescription to the pharmacy.  - You may also stop by our office during regular business hours and pick up a GoodRx coupon card.  - If you need your prescription sent electronically to a different pharmacy, notify our office through [facility] or by phone at [phone] option 4.     Si Usted Necesita Algo Despus de Su Visita  Tambin puede enviarnos un mensaje a travs de Pharmacist, community. Por lo general respondemos a los mensajes de MyChart en el transcurso de 1 a 2 das hbiles.  Para renovar recetas, por favor pida a su farmacia que se ponga en contacto con nuestra oficina. Harland Dingwall de fax es [place] [phone].  Si tiene un asunto urgente cuando la clnica est cerrada y que no puede esperar hasta el siguiente da hbil, puede llamar/localizar a su doctor(a) al nmero que aparece a continuacin.   Por favor, tenga en cuenta que aunque hacemos todo lo posible para estar disponibles para asuntos urgentes fuera del horario de [place], no estamos disponibles las 24 horas del da, los 7 das de la [place].   Si tiene un problema urgente y no puede comunicarse con nosotros, puede optar por buscar atencin mdica  en el consultorio de su doctor(a), en una clnica privada, en un centro de atencin urgente o en una sala de  emergencias.  Si tiene Engineering geologist, por favor llame inmediatamente al 911 o vaya a la sala de emergencias.  Nmeros de bper  - Dr. Nehemiah Massed: [phone]  - Dra. Moye: [phone]  - Dra. Nicole Kindred: [phone]  En caso de inclemencias del [place], por favor llame a Johnsie Kindred principal al [phone] para una actualizacin sobre el [place] de cualquier retraso o cierre.  Consejos para la medicacin en dermatologa: Por favor, guarde las cajas en las que vienen los medicamentos de uso tpico para ayudarle a seguir las instrucciones sobre dnde y cmo usarlos. Las farmacias generalmente imprimen  las instrucciones del medicamento slo en las cajas y no directamente en los tubos del [place].   Si su medicamento es muy caro, por favor, pngase en contacto con Zigmund Daniel llamando al [phone] y presione la opcin 4 o envenos un mensaje a travs de Pharmacist, community.   No podemos decirle cul ser su copago por los medicamentos por adelantado ya que esto es diferente dependiendo de la cobertura de su seguro. Sin embargo, es posible que podamos encontrar un medicamento sustituto a Electrical engineer un formulario para que el seguro cubra el medicamento que se considera necesario.   Si se requiere una autorizacin previa para que su compaa de seguros [place] su medicamento, por favor permtanos de 1 a 2 das hbiles para completar este proceso.  Los precios de los medicamentos varan con frecuencia dependiendo del Environmental consultant de dnde se surte la receta y alguna farmacias pueden ofrecer precios ms baratos.  El sitio web www.goodrx.com tiene cupones para medicamentos de Airline pilot. Los precios aqu no tienen en cuenta lo que podra costar con la ayuda del seguro (puede ser ms barato con su seguro), pero el sitio web puede darle el precio si no utiliz Research scientist (physical sciences).  - Puede imprimir el cupn correspondiente y llevarlo con su receta a la farmacia.  - Tambin puede pasar por  nuestra oficina durante el horario de atencin regular y Charity fundraiser una tarjeta de cupones de GoodRx.  - Si necesita que su receta se enve electrnicamente a una farmacia diferente, informe a nuestra oficina a travs de MyChart de [place] o por telfono llamando al [phone] y presione la opcin 4.

## 2022-07-30 ENCOUNTER — Encounter: Payer: Self-pay | Admitting: Dermatology

## 2022-08-02 ENCOUNTER — Telehealth: Payer: Self-pay

## 2022-08-02 NOTE — Telephone Encounter (Signed)
Patient advised bx showed dysplastic nevus with moderate atypia, will recheck on follow up. Lurlean Horns., RMA

## 2022-08-02 NOTE — Telephone Encounter (Signed)
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 If color coming back, would do repeat shave removal  MAs please call. Thank you!

## 2022-08-07 ENCOUNTER — Encounter: Payer: Self-pay | Admitting: Dermatology

## 2022-09-15 ENCOUNTER — Ambulatory Visit (INDEPENDENT_AMBULATORY_CARE_PROVIDER_SITE_OTHER): Payer: BC Managed Care – PPO | Admitting: Dermatology

## 2022-09-15 ENCOUNTER — Encounter: Payer: Self-pay | Admitting: Dermatology

## 2022-09-15 DIAGNOSIS — L723 Sebaceous cyst: Secondary | ICD-10-CM

## 2022-09-15 NOTE — Patient Instructions (Addendum)
Wound Care Instructions  Cleanse wound gently with soap and water once a day then pat dry with clean gauze. Apply a thin coat of Petrolatum (petroleum jelly, "Vaseline") over the wound (unless you have an allergy to this). We recommend that you use a new, sterile tube of Vaseline. Do not pick or remove scabs. Do not remove the yellow or white "healing tissue" from the base of the wound.  Cover the wound with fresh, clean, nonstick gauze and secure with paper tape. You may use Band-Aids in place of gauze and tape if the wound is small enough, but would recommend trimming much of the tape off as there is often too much. Sometimes Band-Aids can irritate the skin.  You should call the office for your biopsy report after 1 week if you have not already been contacted.  If you experience any problems, such as abnormal amounts of bleeding, swelling, significant bruising, significant pain, or evidence of infection, please call the office immediately.  FOR ADULT SURGERY PATIENTS: If you need something for pain relief you may take 1 extra strength Tylenol (acetaminophen) AND 2 Ibuprofen (200mg each) together every 4 hours as needed for pain. (do not take these if you are allergic to them or if you have a reason you should not take them.) Typically, you may only need pain medication for 1 to 3 days.     Due to recent changes in healthcare laws, you may see results of your pathology and/or laboratory studies on MyChart before the doctors have had a chance to review them. We understand that in some cases there may be results that are confusing or concerning to you. Please understand that not all results are received at the same time and often the doctors may need to interpret multiple results in order to provide you with the best plan of care or course of treatment. Therefore, we ask that you please give us 2 business days to thoroughly review all your results before contacting the office for clarification. Should  we see a critical lab result, you will be contacted sooner.   If You Need Anything After Your Visit  If you have any questions or concerns for your doctor, please call our main line at [phone] and press option 4 to reach your doctor's medical assistant. If no one answers, please leave a voicemail as directed and we will return your call as soon as possible. Messages left after 4 pm will be answered the following business day.   You may also send us a message via MyChart. We typically respond to MyChart messages within 1-2 business days.  For prescription refills, please ask your pharmacy to contact our office. Our fax number is [phone].  If you have an urgent issue when the clinic is closed that cannot wait until the next business day, you can page your doctor at the number below.    Please note that while we do our best to be available for urgent issues outside of office hours, we are not available 24/7.   If you have an urgent issue and are unable to reach us, you may choose to seek medical care at your doctor's office, retail clinic, urgent care center, or emergency room.  If you have a medical emergency, please immediately call 911 or go to the emergency department.  Pager Numbers  - Dr. Kowalski: [phone]  - Dr. Moye: [phone]  - Dr. Stewart: [phone]  In the event of inclement weather, please call our main line at   [phone] for an update on the status of any delays or closures.  Dermatology Medication Tips: Please keep the boxes that topical medications come in in order to help keep track of the instructions about where and how to use these. Pharmacies typically print the medication instructions only on the boxes and not directly on the medication tubes.   If your medication is too expensive, please contact our office at [phone] option 4 or send us a message through MyChart.   We are unable to tell what your co-pay for medications will be in  advance as this is different depending on your insurance coverage. However, we may be able to find a substitute medication at lower cost or fill out paperwork to get insurance to cover a needed medication.   If a prior authorization is required to get your medication covered by your insurance company, please allow us 1-2 business days to complete this process.  Drug prices often vary depending on where the prescription is filled and some pharmacies may offer cheaper prices.  The website www.goodrx.com contains coupons for medications through different pharmacies. The prices here do not account for what the cost may be with help from insurance (it may be cheaper with your insurance), but the website can give you the price if you did not use any insurance.  - You can print the associated coupon and take it with your prescription to the pharmacy.  - You may also stop by our office during regular business hours and pick up a GoodRx coupon card.  - If you need your prescription sent electronically to a different pharmacy, notify our office through [place] MyChart or by phone at [phone] option 4.     Si Usted Necesita Algo Despus de Su Visita  Tambin puede enviarnos un mensaje a travs de MyChart. Por lo general respondemos a los mensajes de MyChart en el transcurso de 1 a 2 das hbiles.  Para renovar recetas, por favor pida a su farmacia que se ponga en contacto con nuestra oficina. Nuestro nmero de fax es el [phone].  Si tiene un asunto urgente cuando la clnica est cerrada y que no puede esperar hasta el siguiente da hbil, puede llamar/localizar a su doctor(a) al nmero que aparece a continuacin.   Por favor, tenga en cuenta que aunque hacemos todo lo posible para estar disponibles para asuntos urgentes fuera del horario de oficina, no estamos disponibles las 24 horas del da, los 7 das de la semana.   Si tiene un problema urgente y no puede comunicarse con nosotros, puede  optar por buscar atencin mdica  en el consultorio de su doctor(a), en una clnica privada, en un centro de atencin urgente o en una sala de emergencias.  Si tiene una emergencia mdica, por favor llame inmediatamente al 911 o vaya a la sala de emergencias.  Nmeros de bper  - Dr. Kowalski: [phone]  - Dra. Moye: [phone]  - Dra. Stewart: [phone]  En caso de inclemencias del tiempo, por favor llame a nuestra lnea principal al [phone] para una actualizacin sobre el estado de cualquier retraso o cierre.  Consejos para la medicacin en dermatologa: Por favor, guarde las cajas en las que vienen los medicamentos de uso tpico para ayudarle a seguir las instrucciones sobre dnde y cmo usarlos. Las farmacias generalmente imprimen las instrucciones del medicamento slo en las cajas y no directamente en los tubos del medicamento.   Si su medicamento es muy caro, por favor, pngase en contacto con   nuestra oficina llamando al [phone] y presione la opcin 4 o envenos un mensaje a travs de MyChart.   No podemos decirle cul ser su copago por los medicamentos por adelantado ya que esto es diferente dependiendo de la cobertura de su seguro. Sin embargo, es posible que podamos encontrar un medicamento sustituto a menor costo o llenar un formulario para que el seguro cubra el medicamento que se considera necesario.   Si se requiere una autorizacin previa para que su compaa de seguros cubra su medicamento, por favor permtanos de 1 a 2 das hbiles para completar este proceso.  Los precios de los medicamentos varan con frecuencia dependiendo del lugar de dnde se surte la receta y alguna farmacias pueden ofrecer precios ms baratos.  El sitio web www.goodrx.com tiene cupones para medicamentos de diferentes farmacias. Los precios aqu no tienen en cuenta lo que podra costar con la ayuda del seguro (puede ser ms barato con su seguro), pero el sitio web puede darle el  precio si no utiliz ningn seguro.  - Puede imprimir el cupn correspondiente y llevarlo con su receta a la farmacia.  - Tambin puede pasar por nuestra oficina durante el horario de atencin regular y recoger una tarjeta de cupones de GoodRx.  - Si necesita que su receta se enve electrnicamente a una farmacia diferente, informe a nuestra oficina a travs de MyChart de [place] o por telfono llamando al [phone] y presione la opcin 4.  

## 2022-09-15 NOTE — Progress Notes (Signed)
   Follow-Up Visit   Subjective  Tiffany Watkins is a 31 y.o. female who presents for the following: Cyst (Here for I&D of cyst on back. Surgery scheduled for 10/12/2022).   The following portions of the chart were reviewed this encounter and updated as appropriate:  Tobacco  Allergies  Meds  Problems  Med Hx  Surg Hx  Fam Hx      Review of Systems: No other skin or systemic complaints except as noted in HPI or Assessment and Plan.   Objective  Well appearing patient in no apparent distress; mood and affect are within normal limits.  A focused examination was performed including back. Relevant physical exam findings are noted in the Assessment and Plan.  Mid Back Subcutaneous nodule.    Assessment & Plan  Inflamed epidermoid cyst of skin Mid Back  Start Strata Triz twice a day once healed.   Mupirocin and band-aid daily after washing.   Incision and Drainage - Mid Back Location: mid back  Informed Consent: Discussed risks (permanent scarring, light or dark discoloration, infection, pain, bleeding, bruising, redness, damage to adjacent structures, and recurrence of the lesion) and benefits of the procedure, as well as the alternatives.  Informed consent was obtained.  Preparation: The area was prepped with alcohol.  Anesthesia: Lidocaine 1% with epinephrine  Procedure Details: An incision was made overlying the lesion. The lesion drained white, chalky cyst material.  A large amount of fluid was drained.    Antibiotic ointment and a sterile pressure dressing were applied. The patient tolerated procedure well.  Total number of lesions drained: 1  Plan: The patient was instructed on post-op care. Recommend OTC analgesia as needed for pain.    Return for TBSE.  I, Emelia Salisbury, CMA, am acting as scribe for Forest Gleason, MD.  Documentation: I have reviewed the above documentation for accuracy and completeness, and I agree with the above.  Forest Gleason, MD

## 2022-09-19 ENCOUNTER — Encounter: Payer: Self-pay | Admitting: Dermatology

## 2022-09-27 ENCOUNTER — Encounter: Payer: Self-pay | Admitting: Dermatology

## 2022-09-27 ENCOUNTER — Other Ambulatory Visit: Payer: Self-pay

## 2022-09-27 MED ORDER — DOXYCYCLINE MONOHYDRATE 100 MG PO CAPS: 100.0000 mg | ORAL_CAPSULE | Freq: Two times a day (BID) | ORAL | 0 refills | Status: AC

## 2022-09-27 NOTE — Telephone Encounter (Signed)
Based on the recent worsening and increased drainage, I am concerned for possible infection. If she is able to come by, I would do a swab (PCR) to check for infection and try to identify what bacteria could be causing infection.   Otherwise, I would recommend putting pressure to drain it as much as possible and would start her on doxycycline 100 mg twice a day for 7 days.  Doxycycline should be taken with food to prevent nausea. Do not lay down for 30 minutes after taking. Be cautious with sun exposure and use good sun protection while on this medication. Pregnant women should not take this medication.   Thank you!

## 2022-10-12 ENCOUNTER — Encounter: Payer: BC Managed Care – PPO | Admitting: Dermatology

## 2022-11-09 ENCOUNTER — Ambulatory Visit (INDEPENDENT_AMBULATORY_CARE_PROVIDER_SITE_OTHER): Payer: BC Managed Care – PPO | Admitting: Dermatology

## 2022-11-09 DIAGNOSIS — L720 Epidermal cyst: Secondary | ICD-10-CM

## 2022-11-09 DIAGNOSIS — D485 Neoplasm of uncertain behavior of skin: Secondary | ICD-10-CM

## 2022-11-09 NOTE — Patient Instructions (Signed)
Wound Care Instructions for After Surgery  On the day following your surgery, you should begin doing daily dressing changes until your sutures are removed: Remove the bandage. Cleanse the wound gently with soap and water.  Make sure you then dry the skin surrounding the wound completely or the tape will not stick to the skin. Do not use cotton balls on the wound. After the wound is clean and dry, apply the ointment (either prescription antibiotic prescribed by your doctor or plain Vaseline if nothing was prescribed) gently with a Q-tip. If you are using a bandaid to cover: Apply a bandaid large enough to cover the entire wound. If you do not have a bandaid large enough to cover the wound OR if you are sensitive to bandaid adhesive: Cut a non-stick pad (such as Telfa) to fit the size of the wound.  Cover the wound with the non-stick pad. If the wound is draining, you may want to add a small amount of gauze on top of the non-stick pad for a little added compression to the area. Use tape to seal the area completely.  For the next 1-2 weeks: Be sure to keep the wound moist with ointment 24/7 to ensure best healing. If you are unable to cover the wound with a bandage to hold the ointment in place, you may need to reapply the ointment several times a day. Do not bend over or lift heavy items to reduce the chance of elevated blood pressure to the wound. Do not participate in particularly strenuous activities.  Below is a list of dressing supplies you might need.  Cotton-tipped applicators - Q-tips Gauze pads (2x2 and/or 4x4) - All-Purpose Sponges New and clean tube of petroleum jelly (Vaseline) OR prescription antibiotic ointment if prescribed Either a bandaid large enough to cover the entire wound OR non-stick dressing material (Telfa) and Tape (Paper or Hypafix)  FOR ADULT SURGERY PATIENTS: If you need something for pain relief, you may take 1 extra strength Tylenol (acetaminophen) and 2  ibuprofen (200 mg) together every 4 hours as needed. (Do not take these medications if you are allergic to them or if you know you cannot take them for any other reason). Typically you may only need pain medication for 1-3 days.   Comments on the Post-Operative Period Slight swelling and redness often appear around the wound. This is normal and will disappear within several days following the surgery. The healing wound will drain a brownish-red-yellow discharge during healing. This is a normal phase of wound healing. As the wound begins to heal, the drainage may increase in amount. Again, this drainage is normal. Notify us if the drainage becomes persistently bloody, excessively swollen, or intensely painful or develops a foul odor or red streaks.  The healing wound will also typically be itchy. This is normal. If you have severe or persistent pain, Notify us if the discomfort is severe or persistent. Avoid alcoholic beverages when taking pain medicine.  In Case of Wound Hemorrhage A wound hemorrhage is when the bandage suddenly becomes soaked with bright red blood and flows profusely. If this happens, sit down or lie down with your head elevated. If the wound has a dressing on it, do not remove the dressing. Apply pressure to the existing gauze. If the wound is not covered, use a gauze pad to apply pressure and continue applying the pressure for 20 minutes without peeking. DO NOT COVER THE WOUND WITH A LARGE TOWEL OR WASH CLOTH. Release your hand from the   wound site but do not remove the dressing. If the bleeding has stopped, gently clean around the wound. Leave the dressing in place for 24 hours if possible. This wait time allows the blood vessels to close off so that you do not spark a new round of bleeding by disrupting the newly clotted blood vessels with an immediate dressing change. If the bleeding does not subside, continue to hold pressure for 40 minutes. If bleeding continues, page your  physician, contact an After Hours clinic or go to the Emergency Room.  Due to recent changes in healthcare laws, you may see results of your pathology and/or laboratory studies on MyChart before the doctors have had a chance to review them. We understand that in some cases there may be results that are confusing or concerning to you. Please understand that not all results are received at the same time and often the doctors may need to interpret multiple results in order to provide you with the best plan of care or course of treatment. Therefore, we ask that you please give us 2 business days to thoroughly review all your results before contacting the office for clarification. Should we see a critical lab result, you will be contacted sooner.   If You Need Anything After Your Visit  If you have any questions or concerns for your doctor, please call our main line at [phone] and press option 4 to reach your doctor's medical assistant. If no one answers, please leave a voicemail as directed and we will return your call as soon as possible. Messages left after 4 pm will be answered the following business day.   You may also send us a message via MyChart. We typically respond to MyChart messages within 1-2 business days.  For prescription refills, please ask your pharmacy to contact our office. Our fax number is [phone].  If you have an urgent issue when the clinic is closed that cannot wait until the next business day, you can page your doctor at the number below.    Please note that while we do our best to be available for urgent issues outside of office hours, we are not available 24/7.   If you have an urgent issue and are unable to reach us, you may choose to seek medical care at your doctor's office, retail clinic, urgent care center, or emergency room.  If you have a medical emergency, please immediately call 911 or go to the emergency department.  Pager Numbers  - Dr. Kowalski:  [phone]  - Dr. Moye: [phone]  - Dr. Stewart: [phone]  In the event of inclement weather, please call our main line at [phone] for an update on the status of any delays or closures.  Dermatology Medication Tips: Please keep the boxes that topical medications come in in order to help keep track of the instructions about where and how to use these. Pharmacies typically print the medication instructions only on the boxes and not directly on the medication tubes.   If your medication is too expensive, please contact our office at [phone] option 4 or send us a message through MyChart.   We are unable to tell what your co-pay for medications will be in advance as this is different depending on your insurance coverage. However, we may be able to find a substitute medication at lower cost or fill out paperwork to get insurance to cover a needed medication.   If a prior authorization is required to get your medication covered by   your insurance company, please allow us 1-2 business days to complete this process.  Drug prices often vary depending on where the prescription is filled and some pharmacies may offer cheaper prices.  The website www.goodrx.com contains coupons for medications through different pharmacies. The prices here do not account for what the cost may be with help from insurance (it may be cheaper with your insurance), but the website can give you the price if you did not use any insurance.  - You can print the associated coupon and take it with your prescription to the pharmacy.  - You may also stop by our office during regular business hours and pick up a GoodRx coupon card.  - If you need your prescription sent electronically to a different pharmacy, notify our office through [facility] MyChart or by phone at [phone] option 4.     Si Usted Necesita Algo Despus de Su Visita  Tambin puede enviarnos un mensaje a travs de MyChart. Por lo general  respondemos a los mensajes de MyChart en el transcurso de 1 a 2 das hbiles.  Para renovar recetas, por favor pida a su farmacia que se ponga en contacto con nuestra oficina. Nuestro nmero de fax es el [phone].  Si tiene un asunto urgente cuando la clnica est cerrada y que no puede esperar hasta el siguiente da hbil, puede llamar/localizar a su doctor(a) al nmero que aparece a continuacin.   Por favor, tenga en cuenta que aunque hacemos todo lo posible para estar disponibles para asuntos urgentes fuera del horario de oficina, no estamos disponibles las 24 horas del da, los 7 das de la semana.   Si tiene un problema urgente y no puede comunicarse con nosotros, puede optar por buscar atencin mdica  en el consultorio de su doctor(a), en una clnica privada, en un centro de atencin urgente o en una sala de emergencias.  Si tiene una emergencia mdica, por favor llame inmediatamente al 911 o vaya a la sala de emergencias.  Nmeros de bper  - Dr. Kowalski: [phone]  - Dra. Moye: [phone]  - Dra. Stewart: [phone]  En caso de inclemencias del tiempo, por favor llame a nuestra lnea principal al [phone] para una actualizacin sobre el estado de cualquier retraso o cierre.  Consejos para la medicacin en dermatologa: Por favor, guarde las cajas en las que vienen los medicamentos de uso tpico para ayudarle a seguir las instrucciones sobre dnde y cmo usarlos. Las farmacias generalmente imprimen las instrucciones del medicamento slo en las cajas y no directamente en los tubos del medicamento.   Si su medicamento es muy caro, por favor, pngase en contacto con nuestra oficina llamando al [phone] y presione la opcin 4 o envenos un mensaje a travs de MyChart.   No podemos decirle cul ser su copago por los medicamentos por adelantado ya que esto es diferente dependiendo de la cobertura de su seguro. Sin embargo, es posible que podamos encontrar un  medicamento sustituto a menor costo o llenar un formulario para que el seguro cubra el medicamento que se considera necesario.   Si se requiere una autorizacin previa para que su compaa de seguros cubra su medicamento, por favor permtanos de 1 a 2 das hbiles para completar este proceso.  Los precios de los medicamentos varan con frecuencia dependiendo del lugar de dnde se surte la receta y alguna farmacias pueden ofrecer precios ms baratos.  El sitio web www.goodrx.com tiene cupones para medicamentos de diferentes farmacias. Los precios aqu no tienen   en cuenta lo que podra costar con la ayuda del seguro (puede ser ms barato con su seguro), pero el sitio web puede darle el precio si no utiliz ningn seguro.  - Puede imprimir el cupn correspondiente y llevarlo con su receta a la farmacia.  - Tambin puede pasar por nuestra oficina durante el horario de atencin regular y recoger una tarjeta de cupones de GoodRx.  - Si necesita que su receta se enve electrnicamente a una farmacia diferente, informe a nuestra oficina a travs de MyChart de [place] o por telfono llamando al [phone] y presione la opcin 4.  

## 2022-11-09 NOTE — Progress Notes (Signed)
   Follow-Up Visit   Subjective  Tiffany Watkins is a 32 y.o. female who presents for the following: Procedure (Patient here today for excision of cyst at upper back. ).   The following portions of the chart were reviewed this encounter and updated as appropriate:   Tobacco  Allergies  Meds  Problems  Med Hx  Surg Hx  Fam Hx      Review of Systems:  No other skin or systemic complaints except as noted in HPI or Assessment and Plan.  Objective  Well appearing patient in no apparent distress; mood and affect are within normal limits.  A focused examination was performed including back. Relevant physical exam findings are noted in the Assessment and Plan.  Upper Back Right of midline 4.1 cm subcutaneous nodule with punctum    Assessment & Plan  Neoplasm of uncertain behavior of skin Upper Back Right of midline  Skin excision  Lesion length (cm):  4.1 Total excision diameter (cm):  4.3 Informed consent: discussed and consent obtained   Timeout: patient name, date of birth, surgical site, and procedure verified   Procedure prep:  Patient was prepped and draped in usual sterile fashion Prep type:  Chlorhexidine Anesthesia: the lesion was anesthetized in a standard fashion   Anesthetic:  1% lidocaine w/ epinephrine 1-100,000 buffered w/ 8.4% NaHCO3 (9.5 cc lido w/epi, 6 cc bupivicaine) Instrument used comment:  15c Hemostasis achieved with: pressure and electrodesiccation    Skin repair Complexity:  Intermediate Final length (cm):  3.4 Informed consent: discussed and consent obtained   Timeout: patient name, date of birth, surgical site, and procedure verified   Procedure prep:  Patient was prepped and draped in usual sterile fashion Prep type:  Chlorhexidine Anesthesia: the lesion was anesthetized in a standard fashion   Anesthetic:  1% lidocaine w/ epinephrine 1-100,000 local infiltration Reason for type of repair: reduce tension to allow closure, reduce the risk of  dehiscence, infection, and necrosis, preserve normal anatomy and preserve normal anatomical and functional relationships   Undermining: edges undermined   Subcutaneous layers (deep stitches):  Suture size:  4-0 Suture type: PDS (polydioxanone)   Fine/surface layer approximation (top stitches):  Suture size:  4-0 Suture type: Prolene (polypropylene)   Suture removal (days):  7 Hemostasis achieved with: suture, pressure and electrodesiccation Outcome: patient tolerated procedure well with no complications   Post-procedure details: wound care instructions given   Additional details:  Mupirocin and a pressure dressing applied  Specimen 1 - Surgical pathology Differential Diagnosis: Cyst vs Other  Check Margins: No 2.3 cm subcutaneous nodule with punctum  Related Medications mupirocin ointment (BACTROBAN) 2 % Apply to wound QD until healed.   Return in about 1 week (around 11/16/2022) for Suture Removal.  Graciella Belton, RMA, am acting as scribe for Forest Gleason, MD .   Documentation: I have reviewed the above documentation for accuracy and completeness, and I agree with the above.  Forest Gleason, MD

## 2022-11-10 ENCOUNTER — Telehealth: Payer: Self-pay

## 2022-11-10 ENCOUNTER — Encounter: Payer: Self-pay | Admitting: Dermatology

## 2022-11-10 NOTE — Telephone Encounter (Signed)
Called patient to see how she is doing following yesterday's surgery. No answer. Left message that she should call office or if office is closed she can page Dr. Laurence Ferrari if she has any concerns.  Lurlean Horns., RMA

## 2022-11-13 ENCOUNTER — Encounter: Payer: Self-pay | Admitting: Dermatology

## 2022-11-16 ENCOUNTER — Other Ambulatory Visit: Payer: Self-pay

## 2022-11-16 ENCOUNTER — Ambulatory Visit (INDEPENDENT_AMBULATORY_CARE_PROVIDER_SITE_OTHER): Payer: BC Managed Care – PPO | Admitting: Dermatology

## 2022-11-16 ENCOUNTER — Encounter: Payer: Self-pay | Admitting: Dermatology

## 2022-11-16 DIAGNOSIS — Z4802 Encounter for removal of sutures: Secondary | ICD-10-CM

## 2022-11-16 MED ORDER — DOXYCYCLINE MONOHYDRATE 100 MG PO CAPS: 100.0000 mg | ORAL_CAPSULE | Freq: Two times a day (BID) | ORAL | 0 refills | Status: AC

## 2022-11-16 NOTE — Progress Notes (Signed)
   Follow-Up Visit   Subjective  Tiffany Watkins is a 32 y.o. female who presents for the following: Suture / Staple Removal (Upper back right of mid line).   The following portions of the chart were reviewed this encounter and updated as appropriate:  Tobacco  Allergies  Meds  Problems  Med Hx  Surg Hx  Fam Hx      Review of Systems: No other skin or systemic complaints except as noted in HPI or Assessment and Plan.   Objective  Well appearing patient in no apparent distress; mood and affect are within normal limits.  A focused examination was performed including bacl. Relevant physical exam findings are noted in the Assessment and Plan.   Assessment & Plan   Encounter for Removal of Sutures - Incision site at the upper back right of midline is clean, dry and intact - Wound cleansed, sutures removed, wound cleansed and steri strips applied.  - Discussed pathology results showing epidermoid cyst, scarred  - Patient advised to keep steri-strips dry until they fall off. - Scars remodel for a full year. - Once steri-strips fall off, patient can apply over-the-counter silicone scar cream each night to help with scar remodeling if desired. - Patient advised to call with any concerns or if they notice any new or changing lesions.   Return for TBSE As Scheduled.  Documentation: I have reviewed the above documentation for accuracy and completeness, and I agree with the above.  Forest Gleason, MD

## 2022-11-16 NOTE — Patient Instructions (Signed)
Due to recent changes in healthcare laws, you may see results of your pathology and/or laboratory studies on MyChart before the doctors have had a chance to review them. We understand that in some cases there may be results that are confusing or concerning to you. Please understand that not all results are received at the same time and often the doctors may need to interpret multiple results in order to provide you with the best plan of care or course of treatment. Therefore, we ask that you please give us 2 business days to thoroughly review all your results before contacting the office for clarification. Should we see a critical lab result, you will be contacted sooner.   If You Need Anything After Your Visit  If you have any questions or concerns for your doctor, please call our main line at [phone] and press option 4 to reach your doctor's medical assistant. If no one answers, please leave a voicemail as directed and we will return your call as soon as possible. Messages left after 4 pm will be answered the following business day.   You may also send us a message via MyChart. We typically respond to MyChart messages within 1-2 business days.  For prescription refills, please ask your pharmacy to contact our office. Our fax number is [phone].  If you have an urgent issue when the clinic is closed that cannot wait until the next business day, you can page your doctor at the number below.    Please note that while we do our best to be available for urgent issues outside of office hours, we are not available 24/7.   If you have an urgent issue and are unable to reach us, you may choose to seek medical care at your doctor's office, retail clinic, urgent care center, or emergency room.  If you have a medical emergency, please immediately call 911 or go to the emergency department.  Pager Numbers  - Dr. Kowalski: [phone]  - Dr. Moye: [phone]  - Dr. Stewart:  [phone]  In the event of inclement weather, please call our main line at [phone] for an update on the status of any delays or closures.  Dermatology Medication Tips: Please keep the boxes that topical medications come in in order to help keep track of the instructions about where and how to use these. Pharmacies typically print the medication instructions only on the boxes and not directly on the medication tubes.   If your medication is too expensive, please contact our office at [phone] option 4 or send us a message through MyChart.   We are unable to tell what your co-pay for medications will be in advance as this is different depending on your insurance coverage. However, we may be able to find a substitute medication at lower cost or fill out paperwork to get insurance to cover a needed medication.   If a prior authorization is required to get your medication covered by your insurance company, please allow us 1-2 business days to complete this process.  Drug prices often vary depending on where the prescription is filled and some pharmacies may offer cheaper prices.  The website www.goodrx.com contains coupons for medications through different pharmacies. The prices here do not account for what the cost may be with help from insurance (it may be cheaper with your insurance), but the website can give you the price if you did not use any insurance.  - You can print the associated coupon and take it with   your prescription to the pharmacy.  - You may also stop by our office during regular business hours and pick up a GoodRx coupon card.  - If you need your prescription sent electronically to a different pharmacy, notify our office through [facility] MyChart or by phone at [phone] option 4.     Si Usted Necesita Algo Despus de Su Visita  Tambin puede enviarnos un mensaje a travs de MyChart. Por lo general respondemos a los mensajes de MyChart en el transcurso de 1 a 2  das hbiles.  Para renovar recetas, por favor pida a su farmacia que se ponga en contacto con nuestra oficina. Nuestro nmero de fax es el [phone].  Si tiene un asunto urgente cuando la clnica est cerrada y que no puede esperar hasta el siguiente da hbil, puede llamar/localizar a su doctor(a) al nmero que aparece a continuacin.   Por favor, tenga en cuenta que aunque hacemos todo lo posible para estar disponibles para asuntos urgentes fuera del horario de oficina, no estamos disponibles las 24 horas del da, los 7 das de la semana.   Si tiene un problema urgente y no puede comunicarse con nosotros, puede optar por buscar atencin mdica  en el consultorio de su doctor(a), en una clnica privada, en un centro de atencin urgente o en una sala de emergencias.  Si tiene una emergencia mdica, por favor llame inmediatamente al 911 o vaya a la sala de emergencias.  Nmeros de bper  - Dr. Kowalski: [phone]  - Dra. Moye: [phone]  - Dra. Stewart: [phone]  En caso de inclemencias del tiempo, por favor llame a nuestra lnea principal al [phone] para una actualizacin sobre el estado de cualquier retraso o cierre.  Consejos para la medicacin en dermatologa: Por favor, guarde las cajas en las que vienen los medicamentos de uso tpico para ayudarle a seguir las instrucciones sobre dnde y cmo usarlos. Las farmacias generalmente imprimen las instrucciones del medicamento slo en las cajas y no directamente en los tubos del medicamento.   Si su medicamento es muy caro, por favor, pngase en contacto con nuestra oficina llamando al [phone] y presione la opcin 4 o envenos un mensaje a travs de MyChart.   No podemos decirle cul ser su copago por los medicamentos por adelantado ya que esto es diferente dependiendo de la cobertura de su seguro. Sin embargo, es posible que podamos encontrar un medicamento sustituto a menor costo o llenar un formulario para que el  seguro cubra el medicamento que se considera necesario.   Si se requiere una autorizacin previa para que su compaa de seguros cubra su medicamento, por favor permtanos de 1 a 2 das hbiles para completar este proceso.  Los precios de los medicamentos varan con frecuencia dependiendo del lugar de dnde se surte la receta y alguna farmacias pueden ofrecer precios ms baratos.  El sitio web www.goodrx.com tiene cupones para medicamentos de diferentes farmacias. Los precios aqu no tienen en cuenta lo que podra costar con la ayuda del seguro (puede ser ms barato con su seguro), pero el sitio web puede darle el precio si no utiliz ningn seguro.  - Puede imprimir el cupn correspondiente y llevarlo con su receta a la farmacia.  - Tambin puede pasar por nuestra oficina durante el horario de atencin regular y recoger una tarjeta de cupones de GoodRx.  - Si necesita que su receta se enve electrnicamente a una farmacia diferente, informe a nuestra oficina a travs de MyChart de [place]   o por telfono llamando al [phone] y presione la opcin 4.  

## 2022-11-26 ENCOUNTER — Encounter: Payer: Self-pay | Admitting: Dermatology

## 2023-08-03 ENCOUNTER — Encounter: Payer: BC Managed Care – PPO | Admitting: Dermatology

## 2023-08-22 IMAGING — MR MR HEAD WO/W CM
15 series · 48 of 48 positions shown · IV contrast (multihance)
Comparison: None.

CLINICAL DATA: Numbness and tingling R20.0, WR0.R (GAT-B5-CM).
Headache disorder J75.5 (GAT-B5-CM). Demyelinating disease (HCC)
T0Z.D (GAT-B5-CM).

EXAM:
MRI HEAD WITHOUT AND WITH CONTRAST
TECHNIQUE: Multiplanar, multiecho pulse sequences of the brain and surrounding
structures were obtained without and with intravenous contrast.
CONTRAST:  20mL MULTIHANCE GADOBENATE DIMEGLUMINE 529 MG/ML IV SOLN

[Series 5: T1 · sagittal · 4.0mm · 0.75mm/px · 1 of 30 slices shown (1 of 3)]
[im 1/30]
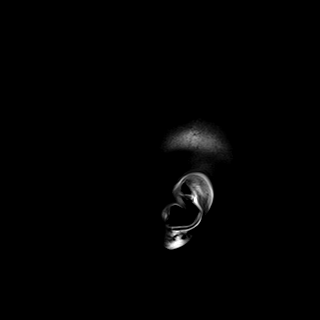

[Series 6: DWI · axial · 3.0mm · 0.94mm/px · z∈[-65,+85]mm · 7 of 172 slices shown (1 of 3)]
[im 1/172]
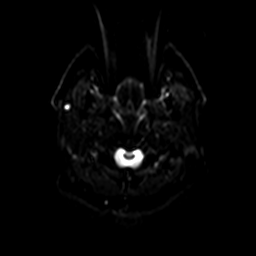
[im 29/172]
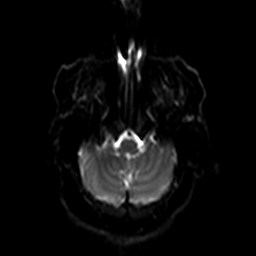
[im 58/172]
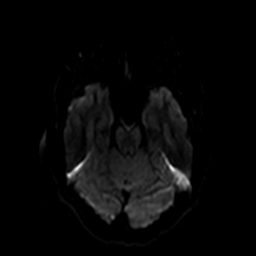
[im 86/172]
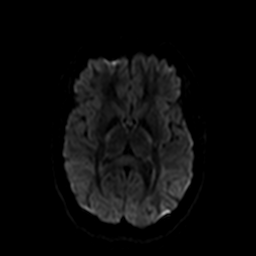
[im 115/172]
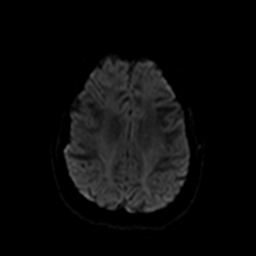
[im 143/172]
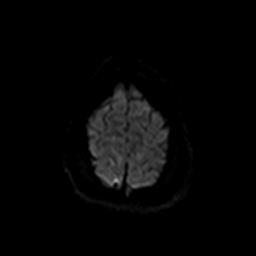
[im 172/172]
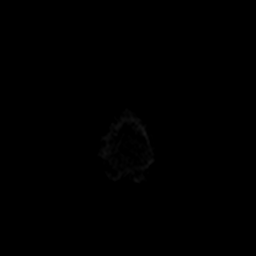

[Series 7: ax dwi_tracew · axial · 3.0mm · 0.94mm/px · z∈[-65,+85]mm · 4 of 86 slices shown]
[im 1/86]
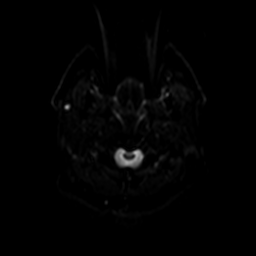
[im 29/86]
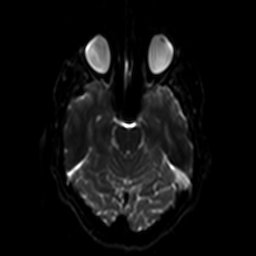
[im 57/86]
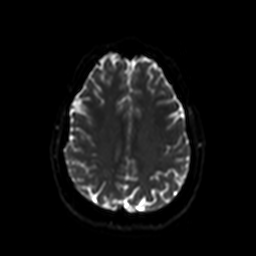
[im 86/86]
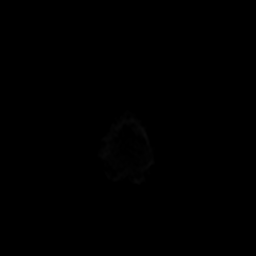

[Series 8: ax dwi_adc · axial · 3.0mm · 0.94mm/px · z∈[-65,+85]mm · 2 of 43 slices shown]
[im 1/43]
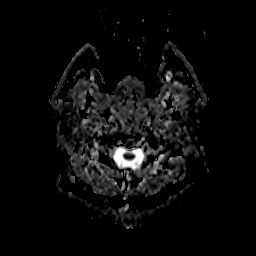
[im 43/43]
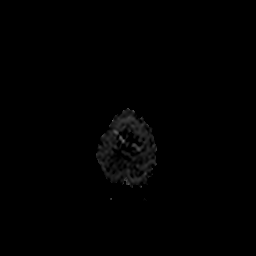

[Series 9: DWI · coronal · 5.0mm · 1.44mm/px · 3 of 68 slices shown (2 of 3)]
[im 1/68]
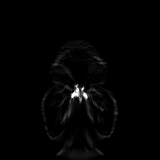
[im 34/68]
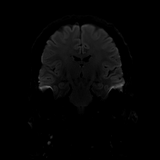
[im 68/68]
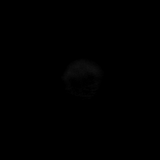

[Series 10: DWI · coronal · 5.0mm · 1.44mm/px · 2 of 34 slices shown (3 of 3)]
[im 1/34]
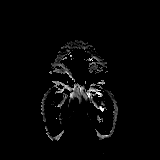
[im 34/34]
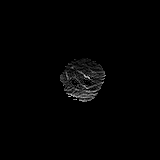

[Series 11: T2 · axial · 4.0mm · 0.36mm/px · z∈[-69,+87]mm · 2 of 31 slices shown (1 of 2)]
[im 1/31]
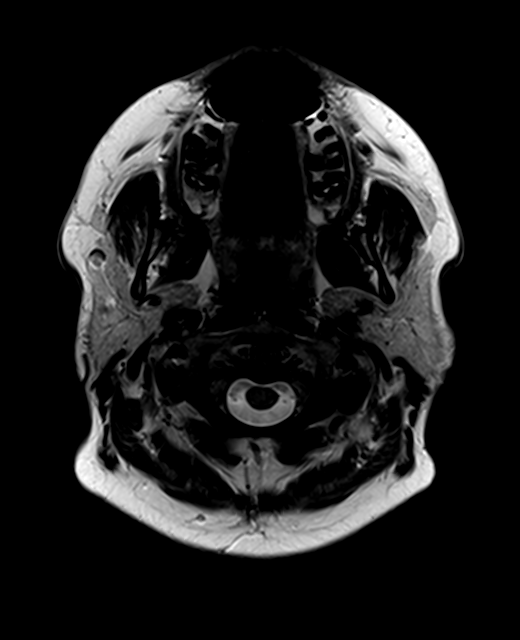
[im 31/31]
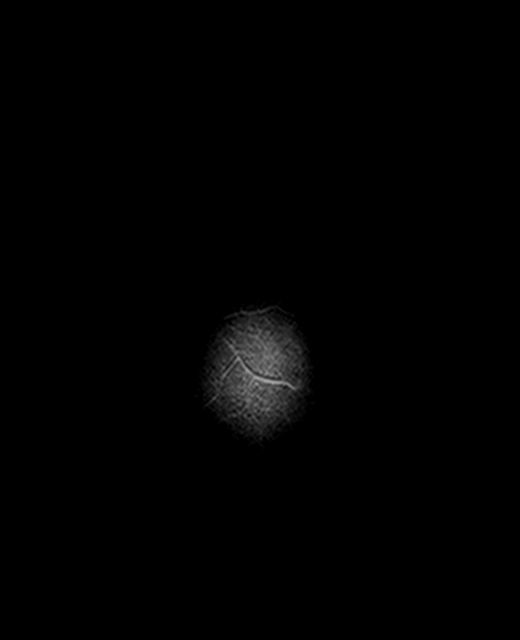

[Series 12: FLAIR · axial · 3.0mm · 0.72mm/px · 1 of 27 slices shown (1 of 2)]
[im 1/27]
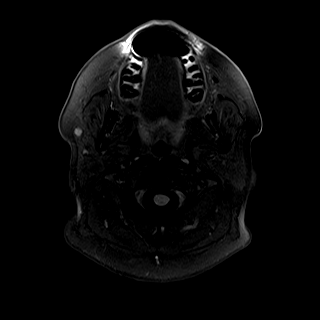

[Series 14: swi_images · axial · 3.0mm · 0.90mm/px · z∈[-74,+91]mm · 3 of 56 slices shown]
[im 1/56]
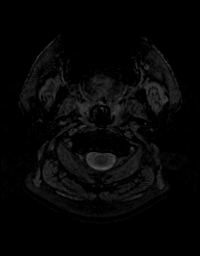
[im 28/56]
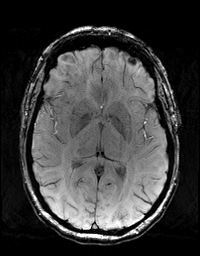
[im 56/56]
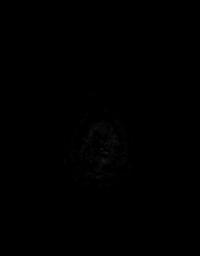

[Series 15: T1 · axial · 1.0mm · 0.90mm/px · z∈[-71,+88]mm · 8 of 160 slices shown (2 of 3)]
[im 1/160]
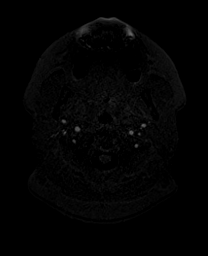
[im 23/160]
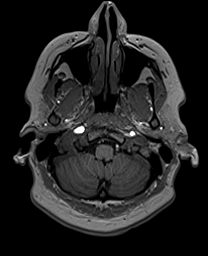
[im 46/160]
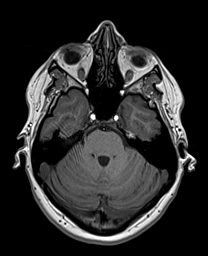
[im 69/160]
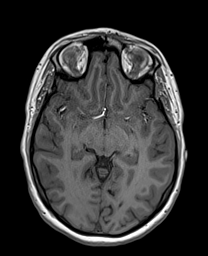
[im 91/160]
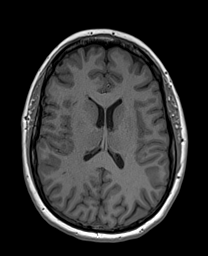
[im 114/160]
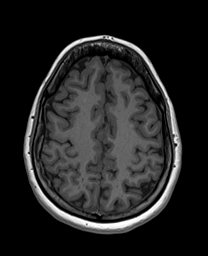
[im 137/160]
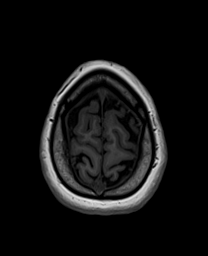
[im 160/160]
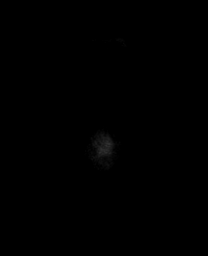

[Series 16: T2 · coronal · 4.5mm · 0.36mm/px · 2 of 32 slices shown (2 of 2)]
[im 1/32]
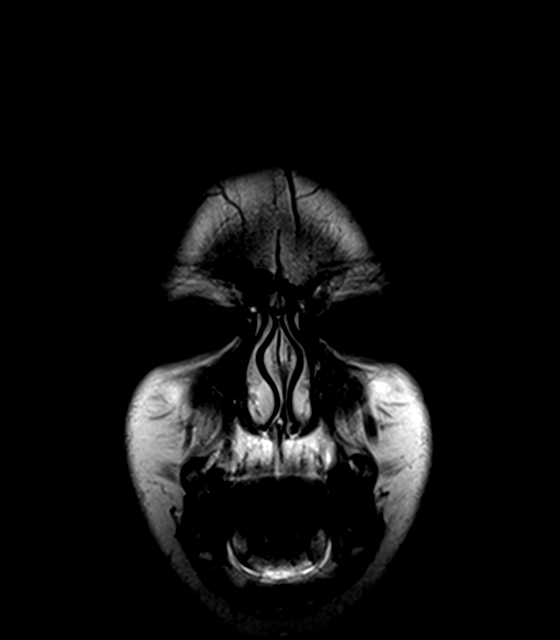
[im 32/32]
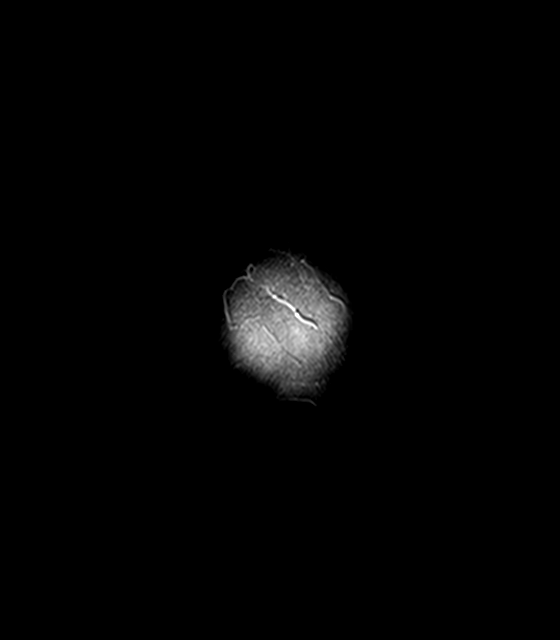

[Series 17: T2 post-contrast · coronal · 4.5mm · 0.36mm/px · 2 of 32 slices shown]
[im 1/32]
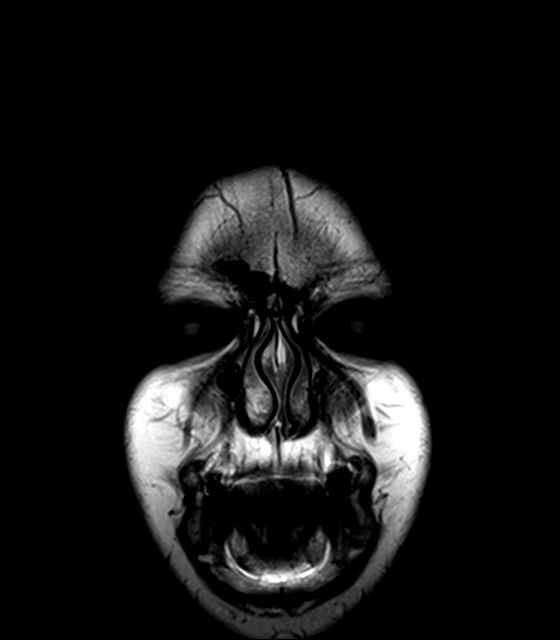
[im 32/32]
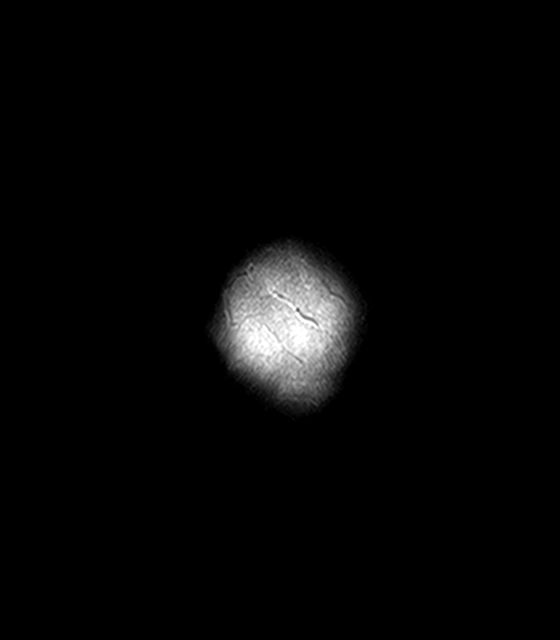

[Series 18: T1 · axial · 1.0mm · 0.94mm/px · z∈[-70,+88]mm · 8 of 160 slices shown (3 of 3)]
[im 1/160]
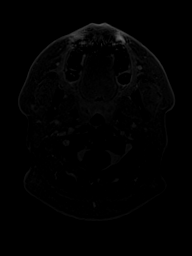
[im 23/160]
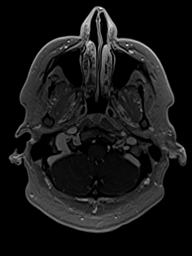
[im 46/160]
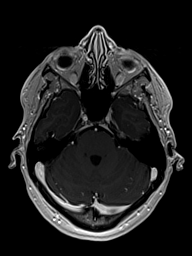
[im 69/160]
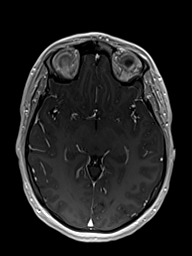
[im 91/160]
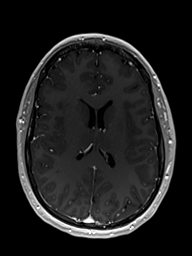
[im 114/160]
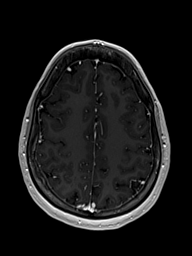
[im 137/160]
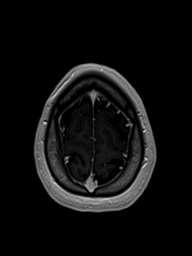
[im 160/160]
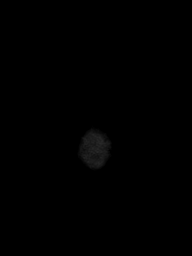

[Series 19: T1 post-contrast · coronal · 4.5mm · 0.72mm/px · 2 of 32 slices shown]
[im 1/32]
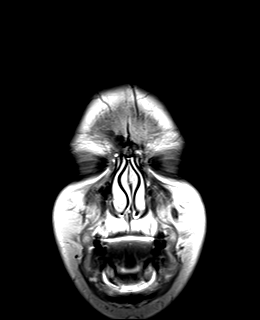
[im 32/32]
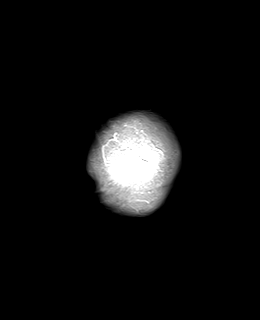

[Series 20: FLAIR · sagittal · 4.0mm · 0.72mm/px · 1 of 28 slices shown (2 of 2)]
[im 1/28]
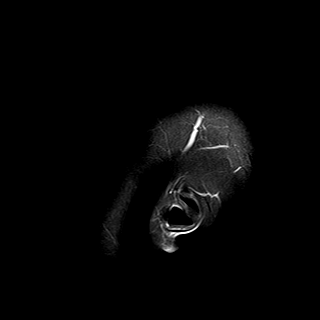

[48 of 48 positions shown; findings below may reference images not displayed]

FINDINGS: Brain: No acute infarction, hemorrhage, hydrocephalus, extra-axial
collection or mass lesion. Small amount of punctate foci of T2
hyperintensity within the white matter of the cerebral hemispheres,
nonspecific. No focus of abnormal contrast enhancement identified.

Vascular: Normal flow voids.

Skull and upper cervical spine: Normal marrow signal.

Sinuses/Orbits: Negative.

Other: None.
IMPRESSION: Small amount of nonspecific punctate of T2 hyperintense lesions in
the juxtacortical white matter of the cerebral hemispheres.
Differential diagnosis include migraines, demyelinating disease,
vasculitis and post inflammatory/infectious processes.

## 2023-08-28 ENCOUNTER — Encounter: Payer: BC Managed Care – PPO | Admitting: Dermatology

## 2023-08-30 ENCOUNTER — Encounter: Payer: BC Managed Care – PPO | Admitting: Dermatology

## 2024-01-03 ENCOUNTER — Ambulatory Visit: Payer: BC Managed Care – PPO | Admitting: Obstetrics

## 2024-01-03 ENCOUNTER — Other Ambulatory Visit (HOSPITAL_COMMUNITY)
Admission: RE | Admit: 2024-01-03 | Discharge: 2024-01-03 | Disposition: A | Discharge status: Home / Self Care | Source: Ambulatory Visit | Attending: Obstetrics | Admitting: Obstetrics

## 2024-01-03 ENCOUNTER — Encounter: Payer: Self-pay | Admitting: Obstetrics

## 2024-01-03 VITALS — BP 132/87 | HR 99 | Ht 66.0 in | Wt 282.0 lb

## 2024-01-03 DIAGNOSIS — Z3201 Encounter for pregnancy test, result positive: Secondary | ICD-10-CM

## 2024-01-03 DIAGNOSIS — Z975 Presence of (intrauterine) contraceptive device: Secondary | ICD-10-CM

## 2024-01-03 DIAGNOSIS — N921 Excessive and frequent menstruation with irregular cycle: Secondary | ICD-10-CM

## 2024-01-03 DIAGNOSIS — R102 Pelvic and perineal pain: Secondary | ICD-10-CM | POA: Diagnosis not present

## 2024-01-03 DIAGNOSIS — R87612 Low grade squamous intraepithelial lesion on cytologic smear of cervix (LGSIL): Secondary | ICD-10-CM | POA: Insufficient documentation

## 2024-01-03 DIAGNOSIS — Z124 Encounter for screening for malignant neoplasm of cervix: Secondary | ICD-10-CM | POA: Insufficient documentation

## 2024-01-03 LAB — POCT URINE PREGNANCY: Preg Test, Ur: POSITIVE — AB

## 2024-01-03 NOTE — Progress Notes (Signed)
    GYNECOLOGY PROGRESS NOTE  Subjective:  PCP: Patient, No Pcp Per  Patient ID: Tiffany Watkins, female    DOB: 1991-06-29, 33 y.o.   MRN: [id number]  HPI  Patient is a 33 y.o. G0P0000 female who presents for cramping and spotting with Rutha Bouchard. PMH of PCOS with irregular periods. This is her 2nd Palau IUD, inserted 01/05/2022 w/Alicia Copland, PA, and she has been amenorrheic with both devices, maybe 1-2 occasions of spotting. About 3 weeks ago, she started bleeding, this was heavier than prior spotting. And about 4 days ago, she started with cramping and pelvic pain. She has also felt nauseated and some R leg twitching since the pain started, and she attributes the twitching to whatever is going on with her IUD.   The following portions of the patient's history were reviewed and updated as appropriate: allergies, current medications, past family history, past medical history, past social history, past surgical history, and problem list.  Review of Systems Pertinent items are noted in HPI.   Objective:   Blood pressure 132/87, pulse 99, height 5\' 6"  (1.676 m), weight 282 lb (127.9 kg). Body mass index is 45.52 kg/m.  Physical Exam Exam conducted with a chaperone present.  Constitutional:      Appearance: Normal appearance.  HENT:     Head: Normocephalic and atraumatic.  Eyes:     Extraocular Movements: Extraocular movements intact.  Pulmonary:     Effort: Pulmonary effort is normal.  Abdominal:     General: Abdomen is flat.     Palpations: Abdomen is soft.  Genitourinary:    General: Normal vulva.     Vagina: Normal.     Cervix: Friability present. No cervical motion tenderness (IUD strings visualized), discharge, erythema or cervical bleeding.     Uterus: Normal. Not enlarged, not tender and no uterine prolapse.   Neurological:     General: No focal deficit present.     Mental Status: She is alert.    Recent Results (from the past 2160 hours)  POCT urine pregnancy      Status: Abnormal   Collection Time: 01/03/24  3:22 PM  Result Value Ref Range   Preg Test, Ur Positive (A) Negative    Assessment/Plan:   1. Breakthrough bleeding with IUD   2. Pelvic cramping   3. LGSIL on Pap smear of cervix   4. Cervical cancer screening   5. Positive urine pregnancy test     Tiffany Watkins is a 33 y.o. G0P0000 with current Kyleena IUD in situ since 2023, now with 3 weeks of cramping, spotting and nausea, with a positive urine pregnancy test in clinic today. Also with LSIL/HPV negative on pap 01/2022 with plans for repeat co-testing in 12 mos, lost to follow up. Pap obtained today.  -hCG quant and pelvic US ordered, will contact with results and next steps.   Julieanne Manson, DO  [facility]

## 2024-01-04 ENCOUNTER — Telehealth: Payer: Self-pay | Admitting: Licensed Practical Nurse

## 2024-01-04 ENCOUNTER — Ambulatory Visit
Admission: RE | Admit: 2024-01-04 | Discharge: 2024-01-04 | Disposition: A | Discharge status: Home / Self Care | Payer: BC Managed Care – PPO | Source: Ambulatory Visit | Attending: Obstetrics | Admitting: Obstetrics

## 2024-01-04 ENCOUNTER — Other Ambulatory Visit: Payer: Self-pay | Admitting: Licensed Practical Nurse

## 2024-01-04 ENCOUNTER — Other Ambulatory Visit: Payer: BC Managed Care – PPO

## 2024-01-04 ENCOUNTER — Encounter: Payer: Self-pay | Admitting: Licensed Practical Nurse

## 2024-01-04 ENCOUNTER — Other Ambulatory Visit: Payer: Self-pay | Admitting: Obstetrics

## 2024-01-04 DIAGNOSIS — O3680X0 Pregnancy with inconclusive fetal viability, not applicable or unspecified: Secondary | ICD-10-CM

## 2024-01-04 DIAGNOSIS — Z124 Encounter for screening for malignant neoplasm of cervix: Secondary | ICD-10-CM

## 2024-01-04 DIAGNOSIS — R87612 Low grade squamous intraepithelial lesion on cytologic smear of cervix (LGSIL): Secondary | ICD-10-CM

## 2024-01-04 DIAGNOSIS — Z975 Presence of (intrauterine) contraceptive device: Secondary | ICD-10-CM | POA: Insufficient documentation

## 2024-01-04 DIAGNOSIS — Z3201 Encounter for pregnancy test, result positive: Secondary | ICD-10-CM

## 2024-01-04 DIAGNOSIS — R102 Pelvic and perineal pain: Secondary | ICD-10-CM | POA: Diagnosis present

## 2024-01-04 DIAGNOSIS — N921 Excessive and frequent menstruation with irregular cycle: Secondary | ICD-10-CM | POA: Insufficient documentation

## 2024-01-04 LAB — BETA HCG QUANT (REF LAB): hCG Quant: 121 m[IU]/mL

## 2024-01-04 NOTE — Progress Notes (Signed)
 Pt seen by Dr Lonny Prude on 2/26  for cramping and spotting with IUD, her beta was 121.  Call from Radiology today US shows  IMPRESSION: 1. Pregnancy of unknown location. Ill-defined structure within the right adnexa, inseparable from the right ovary raises suspicion for ectopic pregnancy in the setting of positive beta hCG. Additional differential includes early intrauterine pregnancy or completed spontaneous abortion. 2. Appropriately positioned intrauterine device  Reviewed with Dr Valentino Saxon, recommend repeat Beta and fu with Dr Lonny Prude on Monday   Call to pt Reviewed [place] finding and recommendations Pt in agreement with plan  Will send mychart message to pt that explains conversation. Carie Caddy, CNM   [facility]  01/04/24  4:33 PM

## 2024-01-04 NOTE — Telephone Encounter (Signed)
 Reached out to pt to reschedule lab appt that was scheduled on 01/04/2024 at 3:00 per LMD.  Left message for pt to call back.

## 2024-01-05 ENCOUNTER — Ambulatory Visit: Payer: BC Managed Care – PPO

## 2024-01-05 ENCOUNTER — Other Ambulatory Visit: Payer: BC Managed Care – PPO

## 2024-01-05 DIAGNOSIS — O3680X0 Pregnancy with inconclusive fetal viability, not applicable or unspecified: Secondary | ICD-10-CM

## 2024-01-06 ENCOUNTER — Other Ambulatory Visit: Payer: Self-pay | Admitting: Licensed Practical Nurse

## 2024-01-06 DIAGNOSIS — O3680X0 Pregnancy with inconclusive fetal viability, not applicable or unspecified: Secondary | ICD-10-CM

## 2024-01-06 LAB — BETA HCG QUANT (REF LAB): hCG Quant: 152 m[IU]/mL

## 2024-01-06 NOTE — Progress Notes (Signed)
 Pt with pregnancy of unknown location beta 121 and now 152, reviewed with Dr Lonny Prude. May treat as presumptive ectopic or pt may wait and have another [place] in 1 week.  Dr Lonny Prude to reach out to pt on Monday  [place] ordered mychart message sent  Jannifer Hick  [facility]  01/06/24  1:49 PM

## 2024-01-08 ENCOUNTER — Encounter: Payer: Self-pay | Admitting: Obstetrics

## 2024-01-08 ENCOUNTER — Telehealth: Admitting: Obstetrics

## 2024-01-08 DIAGNOSIS — O00201 Right ovarian pregnancy without intrauterine pregnancy: Secondary | ICD-10-CM | POA: Insufficient documentation

## 2024-01-08 DIAGNOSIS — Z09 Encounter for follow-up examination after completed treatment for conditions other than malignant neoplasm: Secondary | ICD-10-CM | POA: Diagnosis not present

## 2024-01-08 NOTE — Patient Instructions (Addendum)
 Morton Peters,  -We will arrange for medication to be given through same-day surgery with initial labwork done there. The day the medication is given is "Day 1." -You will return to our clinic on day 4 and 7 to have HCG re-tested. This will help [place] know if you need further doses of the Methotrexate.  -If you develop painful cramping, Tylenol only and the Rx we sent in for you if analgesic is needed. Avoid NSAIDs (Ibuprofen, Aleve). -Refrain from sexual intercourse and strenuous exercise.

## 2024-01-08 NOTE — Telephone Encounter (Signed)
 Patient following up. She hasn't heard from Dr. Lonny Prude yet, but she has thought of some questions she would like to ask.   1) She has decided to not do another ultrasound she would like to go ahead with meds. Inquiring what to expect with medication. 1) States she would like IUD removed before medication is given.

## 2024-01-08 NOTE — Telephone Encounter (Signed)
 Pt was called in error.

## 2024-01-08 NOTE — Telephone Encounter (Signed)
 The patient is scheduled for 3:15 with virtual visit

## 2024-01-08 NOTE — Progress Notes (Signed)
   Virtual Visit via Video Note  I connected with Tiffany Watkins on 01/08/24 at   3:15 PM EST by a video enabled telemedicine application and verified that I am speaking with the correct person using two identifiers.  Location: Patient: Home Provider: [facility] Office   I discussed the limitations of evaluation and management by telemedicine and the availability of in person appointments. The patient expressed understanding and agreed to proceed.   History of Present Illness:   Tiffany Watkins is a 33 y.o. G63P0000 female who presents for a televisit for follow up on ectopic pregnancy. She was seen in clinic 01/03/24 for cramping and spotting with IUD, current Kyleena in place since 2023. Urine pregnancy test in office positive, bHCG same day was 121 and rose to 152 48hrs later on 01/05/24. US showed no intrauterine structures and a "Ill defined heterogeneously hypoechoic structure within the right adnexa supicious for ectopic pregnancy." Discussed non-rising hCG and US findings with pt, who does desire future IUP, but due to diagnostic findings, desires to move forward with Methotrexate.    The following portions of the patient's history were reviewed and updated as appropriate: allergies, current medications, past family history, past medical history, past social history, past surgical history, and problem list.   Observations/Objective:   There were no vitals taken for this visit. Gen App: NAD Psych: normal speech, affect. Good mood.    Assessment/Plan  Tiffany Watkins is a 33 y.o. G1P0000 diagnosed with a R ectopic pregnancy and has Palau IUD in place. Reviewed all labs and studies together, went over options again of Methotrexate vs. Repeating US and pt desire to move ahead with medication now. She also desires IUD removal now. We discussed that IUD removal at this time is not necessary, and will provide contraception for the 3 mos post-methotrexate, but she prefers it out sooner than later.   -We will arrange for medication to be given through same-day surgery with initial labs -Tylenol only if analgesic is needed. Avoid NSAIDs.  -Refrain from sexual intercourse and strenuous exercise.  -She is aware she may require and second dose of methotrexate.   Follow Up Instructions:  I discussed the assessment and treatment plan with the patient. The patient was provided an opportunity to ask questions and all were answered. The patient agreed with the plan and demonstrated an understanding of the instructions.   The patient was advised to call back or seek an in-person evaluation if the symptoms worsen or if the condition fails to improve as anticipated.   Total time was 20 minutes. That includes chart review before the visit, the actual patient visit, and time spent on documentation and patient care coordination after the visit.     Julieanne Manson, DO [facility]

## 2024-01-09 ENCOUNTER — Other Ambulatory Visit: Payer: Self-pay

## 2024-01-09 ENCOUNTER — Encounter: Payer: Self-pay | Admitting: Obstetrics

## 2024-01-09 ENCOUNTER — Ambulatory Visit
Admission: RE | Admit: 2024-01-09 | Discharge: 2024-01-09 | Disposition: A | Discharge status: Home / Self Care | Source: Ambulatory Visit | Attending: Obstetrics | Admitting: Obstetrics

## 2024-01-09 ENCOUNTER — Other Ambulatory Visit: Payer: Self-pay | Admitting: Obstetrics

## 2024-01-09 DIAGNOSIS — O00201 Right ovarian pregnancy without intrauterine pregnancy: Secondary | ICD-10-CM

## 2024-01-09 DIAGNOSIS — Z01818 Encounter for other preprocedural examination: Secondary | ICD-10-CM | POA: Diagnosis present

## 2024-01-09 DIAGNOSIS — O3680X0 Pregnancy with inconclusive fetal viability, not applicable or unspecified: Secondary | ICD-10-CM

## 2024-01-09 LAB — COMPREHENSIVE METABOLIC PANEL
ALT: 58 U/L — ABNORMAL HIGH (ref 0–44)
AST: 36 U/L (ref 15–41)
Albumin: 4.3 g/dL (ref 3.5–5.0)
Alkaline Phosphatase: 70 U/L (ref 38–126)
Anion gap: 10 (ref 5–15)
BUN: 14 mg/dL (ref 6–20)
CO2: 24 mmol/L (ref 22–32)
Calcium: 9.4 mg/dL (ref 8.9–10.3)
Chloride: 102 mmol/L (ref 98–111)
Creatinine, Ser: 0.91 mg/dL (ref 0.44–1.00)
GFR, Estimated: >60 mL/min (ref >60)
Glucose, Bld: 90 mg/dL (ref 70–99)
Potassium: 4.1 mmol/L (ref 3.5–5.1)
Sodium: 136 mmol/L (ref 135–145)
Total Bilirubin: 0.8 mg/dL (ref 0.0–1.2)
Total Protein: 8.1 g/dL (ref 6.5–8.1)

## 2024-01-09 LAB — TYPE AND SCREEN
ABO/RH(D): O POS
Antibody Screen: NEGATIVE

## 2024-01-09 LAB — CBC WITH DIFFERENTIAL/PLATELET
Abs Immature Granulocytes: 0.08 10*3/uL — ABNORMAL HIGH (ref 0.00–0.07)
Basophils Absolute: 0.1 10*3/uL (ref 0.0–0.1)
Basophils Relative: 1 %
Eosinophils Absolute: 0.2 10*3/uL (ref 0.0–0.5)
Eosinophils Relative: 2 %
HCT: 44.2 % (ref 36.0–46.0)
Hemoglobin: 15.1 g/dL — ABNORMAL HIGH (ref 12.0–15.0)
Immature Granulocytes: 1 %
Lymphocytes Relative: 29 %
Lymphs Abs: 3.6 10*3/uL (ref 0.7–4.0)
MCH: 29.4 pg (ref 26.0–34.0)
MCHC: 34.2 g/dL (ref 30.0–36.0)
MCV: 86.2 fL (ref 80.0–100.0)
Monocytes Absolute: 0.6 10*3/uL (ref 0.1–1.0)
Monocytes Relative: 5 %
Neutro Abs: 7.9 10*3/uL — ABNORMAL HIGH (ref 1.7–7.7)
Neutrophils Relative %: 62 %
Platelets: 364 10*3/uL (ref 150–400)
RBC: 5.13 MIL/uL — ABNORMAL HIGH (ref 3.87–5.11)
RDW: 13.7 % (ref 11.5–15.5)
WBC: 12.5 10*3/uL — ABNORMAL HIGH (ref 4.0–10.5)
nRBC: 0 % (ref 0.0–0.2)

## 2024-01-09 LAB — HCG, QUANTITATIVE, PREGNANCY: hCG, Beta Chain, Quant, S: 216 m[IU]/mL — ABNORMAL HIGH (ref <5)

## 2024-01-09 MED ORDER — METHOTREXATE FOR ECTOPIC PREGNANCY
50.0000 mg/m2 | Freq: Once | INTRAMUSCULAR | Status: AC
  Administered 2024-01-09: 122.5 mg via INTRAMUSCULAR
  Filled 2024-01-09: qty 4.9

## 2024-01-09 MED ORDER — OXYCODONE-ACETAMINOPHEN 5-325 MG PO TABS: 1.0000 | ORAL_TABLET | Freq: Four times a day (QID) | ORAL | 0 refills | Status: DC | PRN

## 2024-01-09 NOTE — Progress Notes (Unsigned)
 Orders for bHCG at day 4 and 7

## 2024-01-09 NOTE — Telephone Encounter (Signed)
 When do you want to schedule the IUD removal? Will it be here in office?

## 2024-01-10 ENCOUNTER — Encounter: Payer: Self-pay | Admitting: Obstetrics

## 2024-01-10 LAB — CYTOLOGY - PAP
Chlamydia: NEGATIVE
Comment: NEGATIVE
Comment: NEGATIVE
Comment: NEGATIVE
Comment: NORMAL
High risk HPV: NEGATIVE
Neisseria Gonorrhea: NEGATIVE
Trichomonas: NEGATIVE

## 2024-01-11 ENCOUNTER — Other Ambulatory Visit: Payer: Self-pay | Admitting: Obstetrics

## 2024-01-11 DIAGNOSIS — O00201 Right ovarian pregnancy without intrauterine pregnancy: Secondary | ICD-10-CM

## 2024-01-11 MED ORDER — OXYCODONE-ACETAMINOPHEN 5-325 MG PO TABS: 1.0000 | ORAL_TABLET | Freq: Four times a day (QID) | ORAL | 0 refills | Status: AC | PRN

## 2024-01-11 NOTE — Telephone Encounter (Signed)
 Can you resend her pain medicine?

## 2024-01-12 ENCOUNTER — Other Ambulatory Visit

## 2024-01-12 DIAGNOSIS — O00201 Right ovarian pregnancy without intrauterine pregnancy: Secondary | ICD-10-CM

## 2024-01-12 NOTE — Progress Notes (Signed)
    GYNECOLOGY OFFICE PROCEDURE NOTE  Subjective Tiffany Watkins is a 33 y.o. G1P0000 here for [facility] IUD removal. Is currently on Day 7 s/p 1st Methotrexate injection for R-sided ectopic and had HCG drawn today.  Last pap smear was on 01/03/24 and was Abnormal.LSIL HPV NEGATIVE. Pt aware she should not get pregnant for 3 most post Methotrexate and understands her fertility will return immediately upon removal of Kyleena. She desires future pregnancy and wants IUD out now.   OBJECTIVE Vitals:   01/15/24 1124  BP: 120/80  Pulse: 66   IUD Removal  Patient identified, informed consent performed, consent signed.  Patient was in the dorsal lithotomy position, normal external genitalia was noted.  A speculum was placed in the patient's vagina, normal discharge was noted, no lesions. The cervix was visualized, no lesions, no abnormal discharge.  The strings of the IUD were not visualized; they were teased into the os with a cytobrush. The strings were grasped and pulled using ring forceps. The IUD was removed in its entirety.  IUD was shown to the patient. Patient tolerated the procedure well.    Assessment/Plan Tiffany Watkins IUD removed today per procedure note without complication, tolerated well. Discussed immediate return of fertility, anticipatory guidance for cramping, spotting, and return of menses. -Pt desires future pregnancy -Tylenol prn for pain/cramping -RTC if period absent for more than 12 weeks, sooner prn  LSIL, HPV negative -Recommend colposcopy due to persistence of LSIL -RTC in 2-4 weeks when able  Ectopic, right -S/p Methotrexate on 01/09/24, HCG 216 -Day 4 HCG = 139 -Today is day 7, HCG pending   Tiffany Manson, DO [facility]

## 2024-01-13 LAB — BETA HCG QUANT (REF LAB): hCG Quant: 139 m[IU]/mL

## 2024-01-15 ENCOUNTER — Other Ambulatory Visit

## 2024-01-15 ENCOUNTER — Encounter: Payer: Self-pay | Admitting: Obstetrics

## 2024-01-15 ENCOUNTER — Ambulatory Visit: Admitting: Obstetrics

## 2024-01-15 VITALS — BP 120/80 | HR 66 | Ht 66.0 in | Wt 282.3 lb

## 2024-01-15 DIAGNOSIS — Z30432 Encounter for removal of intrauterine contraceptive device: Secondary | ICD-10-CM | POA: Diagnosis not present

## 2024-01-15 DIAGNOSIS — R87612 Low grade squamous intraepithelial lesion on cytologic smear of cervix (LGSIL): Secondary | ICD-10-CM | POA: Insufficient documentation

## 2024-01-15 DIAGNOSIS — O00201 Right ovarian pregnancy without intrauterine pregnancy: Secondary | ICD-10-CM

## 2024-01-16 ENCOUNTER — Encounter: Payer: Self-pay | Admitting: Obstetrics

## 2024-01-16 ENCOUNTER — Other Ambulatory Visit: Payer: Self-pay | Admitting: Obstetrics

## 2024-01-16 DIAGNOSIS — O00201 Right ovarian pregnancy without intrauterine pregnancy: Secondary | ICD-10-CM

## 2024-01-16 LAB — BETA HCG QUANT (REF LAB): hCG Quant: 109 m[IU]/mL

## 2024-01-16 NOTE — Progress Notes (Signed)
 Standing HCG order to trend down after Methotrexate for ectopic.

## 2024-01-31 ENCOUNTER — Other Ambulatory Visit

## 2024-01-31 ENCOUNTER — Encounter: Payer: Self-pay | Admitting: Obstetrics

## 2024-01-31 DIAGNOSIS — O00201 Right ovarian pregnancy without intrauterine pregnancy: Secondary | ICD-10-CM

## 2024-02-01 LAB — BETA HCG QUANT (REF LAB): hCG Quant: <1 m[IU]/mL

## 2024-02-12 NOTE — Progress Notes (Unsigned)
   Referring Provider:  Lonny Prude  HPI:  Tiffany Watkins is a 33 y.o.  G1P0000  who presents today for evaluation and management of abnormal cervical cytology.    Prior pap smears:  Date:01/03/24 WUJ:WJXB  HPV: NEG  Date:01/05/22 JYN:WGNF  HPV: NEG Date:01/04/21 AOZ:HYQM HPV:POS Date:12/13/19 VHQ:IONG  HPV:N/A   Prior cervical / vaginal findings: *** Date:*** Results: ***  Prior cervical treatment(s): *** Date:*** Results: ***  Symptoms/History:  -Abnormal vaginal discharge: *** -Postmenopausal: *** -Intermenstrual bleeding: *** -Postcoital bleeding: *** -Bleeding problems (non-gyn): *** -Contraception: *** -Number of current sexual partners: *** -Number of partners in lifetime: *** -History of a high risk partner: *** -History of STDs: *** -Smoking: ***      ROS:  {Ros - complete:30496}  OB History  Gravida Para Term Preterm AB Living  1 0 0 0 0 0  SAB IAB Ectopic Multiple Live Births  0 0 0 0 0    # Outcome Date GA Lbr Len/2nd Weight Sex Type Anes PTL Lv  1 Current             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  She has a current medication list which includes the following prescription(s): levonorgestrel.  Physical  Exam: -Vitals:  There were no vitals taken for this visit.  PROCEDURE: Colposcopy performed with 4% acetic acid and Lugol's after informed consent obtained.  Physical Exam                            -Aceto-white Lesions Location(s): See above              -Biopsy performed at *** o'clock               -ECC indicated and performed: {yes no:314532}     -Biopsy sites made hemostatic with pressure and Monsel's solution   -Satisfactory colposcopy: {yes no:314532}    -Evidence of Invasive cervical CA :  NO  ASSESSMENT:  Tiffany Watkins is a 33 y.o. G1P0000 with LSIL***ASCUS and HPV-HR positive*** 16/18/45 POS***NEG on recent pap (DATE), here for colposcopy today, performed as above without complications.  -ECC and 3 cervical bx sent to pathology -Aftercare instructions for home reviewed, si/sx of when to call/return discussed. -RTC 2 weeks for results, sooner prn  No orders of the defined types were placed in this encounter.          F/U  No follow-ups on file.   Julieanne Manson, DO [facility]

## 2024-02-12 NOTE — Patient Instructions (Signed)
 Colposcopy, Care After  The following information offers guidance on how to care for yourself after your procedure. Your doctor may also give you more specific instructions. If you have problems or questions, contact your doctor. What can I expect after the procedure? If you did not have a sample of your tissue taken out (did not have a biopsy), you may only have some spotting of blood for a few days. You can go back to your normal activities. If you had a sample of your tissue taken out, it is common to have: Soreness and mild pain. These may last for a few days. Mild bleeding or fluid (discharge) coming from your vagina. The fluid will look dark and grainy. You may have this for a few days. The fluid may be caused by a liquid that was used during your procedure. You may need to wear a sanitary pad. Spotting of blood for at least 48 hours after the procedure. Follow these instructions at home: Medicines Take over-the-counter and prescription medicines only as told by your doctor. Ask your doctor what over-the-counter pain medicines and prescription medicines you can start taking again. This is very important if you take blood thinners. Activity For at least 3 days, or for as long as told by your doctor, avoid: Douching. Using tampons. Having sex. Return to your normal activities as told by your doctor. Ask your doctor what activities are safe for you. General instructions Ask your doctor if you may take baths, swim, or use a hot tub. You may take showers. If you use birth control (contraception), keep using it. Keep all follow-up visits. Contact a doctor if: You have a fever or chills. You faint or feel light-headed. Get help right away if: You bleed a lot from your vagina. A lot of bleeding means that the bleeding soaks through a pad in less than 1 hour. You have clumps of blood (blood clots) coming from your vagina. You have signs that could mean you have an infection. This may be  fluid coming from your vagina that is: Different than normal. Yellow. Bad-smelling. You have very bad pain or cramps in your lower belly that do not get better with medicine. Summary If you did not have a sample of your tissue taken out, you may only have some spotting of blood for a few days. You can go back to your normal activities. If you had a sample of your tissue taken out, it is common to have mild pain for a few days and spotting for 48 hours. Avoid douching, using tampons, and having sex for at least 3 days after the procedure or for as long as told. Get help right away if you have a lot of bleeding, very bad pain, or signs of infection. This information is not intended to replace advice given to you by your health care provider. Make sure you discuss any questions you have with your health care provider. Document Revised: 03/21/2021 Document Reviewed: 03/21/2021 Elsevier Patient Education  2024 [employer].

## 2024-02-14 ENCOUNTER — Ambulatory Visit (INDEPENDENT_AMBULATORY_CARE_PROVIDER_SITE_OTHER): Admitting: Obstetrics

## 2024-02-14 ENCOUNTER — Encounter: Payer: Self-pay | Admitting: Obstetrics

## 2024-02-14 ENCOUNTER — Other Ambulatory Visit (HOSPITAL_COMMUNITY)
Admission: RE | Admit: 2024-02-14 | Discharge: 2024-02-14 | Disposition: A | Discharge status: Home / Self Care | Source: Ambulatory Visit | Attending: Obstetrics | Admitting: Obstetrics

## 2024-02-14 VITALS — BP 116/80 | HR 79 | Ht 66.0 in | Wt 284.0 lb

## 2024-02-14 DIAGNOSIS — B977 Papillomavirus as the cause of diseases classified elsewhere: Secondary | ICD-10-CM

## 2024-02-14 DIAGNOSIS — N870 Mild cervical dysplasia: Secondary | ICD-10-CM | POA: Diagnosis not present

## 2024-02-14 DIAGNOSIS — Z3202 Encounter for pregnancy test, result negative: Secondary | ICD-10-CM | POA: Diagnosis not present

## 2024-02-14 DIAGNOSIS — R87612 Low grade squamous intraepithelial lesion on cytologic smear of cervix (LGSIL): Secondary | ICD-10-CM | POA: Insufficient documentation

## 2024-02-14 LAB — POCT URINE PREGNANCY: Preg Test, Ur: NEGATIVE

## 2024-02-20 LAB — SURGICAL PATHOLOGY

## 2024-02-21 ENCOUNTER — Encounter: Payer: Self-pay | Admitting: Obstetrics

## 2024-05-07 ENCOUNTER — Encounter: Payer: Self-pay | Admitting: Obstetrics

## 2024-05-07 NOTE — Telephone Encounter (Signed)
 Does she need to have an appointment since she just had an ectopic in March? Or should she monitor this AUB?

## 2024-06-18 ENCOUNTER — Ambulatory Visit (INDEPENDENT_AMBULATORY_CARE_PROVIDER_SITE_OTHER)

## 2024-06-18 VITALS — BP 120/86 | HR 88 | Wt 277.0 lb

## 2024-06-18 DIAGNOSIS — Z3201 Encounter for pregnancy test, result positive: Secondary | ICD-10-CM

## 2024-06-18 DIAGNOSIS — Z3687 Encounter for antenatal screening for uncertain dates: Secondary | ICD-10-CM

## 2024-06-18 DIAGNOSIS — Z3200 Encounter for pregnancy test, result unknown: Secondary | ICD-10-CM

## 2024-06-18 DIAGNOSIS — N912 Amenorrhea, unspecified: Secondary | ICD-10-CM

## 2024-06-18 LAB — POCT URINE PREGNANCY: Preg Test, Ur: POSITIVE — AB

## 2024-06-18 NOTE — Progress Notes (Signed)
    NURSE VISIT NOTE   Subjective:    Patient ID: Tiffany Watkins, female    DOB: 03/12/1991, 33 y.o.   MRN: [id number]  HPI  Patient is a 33 y.o. G55P0000 female who presents for evaluation of amenorrhea. She believes she could be pregnant. Pregnancy is desired. Current symptoms also include: breast tenderness, frequent urination, nausea, and positive home pregnancy test. Last period May but in June 22nd she started spotting for 18 days she previously had an ectopic pregnancy in February.Beta ordered.      Objective:    BP 120/86   Pulse 88   Wt 277 lb (125.6 kg)   LMP 04/28/2024   Breastfeeding No   BMI 44.71 kg/m   Lab Review  Results for orders placed or performed in visit on 06/18/24  POCT urine pregnancy  Result Value Ref Range   Preg Test, Ur Positive (A) Negative    Assessment:   1. Possible pregnancy, not confirmed   2. Encounter for antenatal screening for uncertain dates     Plan:   Pregnancy Test: Positive  Estimated Date of Delivery: None noted. BP Cuff Measurement taken. Cuff Size Adult Large Encouraged well-balanced diet, plenty of rest when needed, pre-natal vitamins daily and walking for exercise.  Discussed self-help for nausea, avoiding OTC medications until consulting provider or pharmacist, other than Tylenol  as needed, minimal caffeine (1-2 cups daily) and avoiding alcohol.   She will schedule her nurse visit @ 7-[redacted] wks pregnant, u/s for dating @10  wk, and NOB visit at [redacted] wk pregnant.    Feel free to call with any questions.     Tiffany Watkins H Esiah Bazinet, CMA

## 2024-06-19 LAB — BETA HCG QUANT (REF LAB): hCG Quant: 465 m[IU]/mL

## 2024-06-20 ENCOUNTER — Other Ambulatory Visit

## 2024-06-20 DIAGNOSIS — Z3687 Encounter for antenatal screening for uncertain dates: Secondary | ICD-10-CM

## 2024-06-20 DIAGNOSIS — O00201 Right ovarian pregnancy without intrauterine pregnancy: Secondary | ICD-10-CM

## 2024-06-20 DIAGNOSIS — Z3200 Encounter for pregnancy test, result unknown: Secondary | ICD-10-CM

## 2024-06-21 LAB — BETA HCG QUANT (REF LAB): hCG Quant: 911 m[IU]/mL

## 2024-06-28 ENCOUNTER — Ambulatory Visit

## 2024-06-28 VITALS — BP 121/83 | HR 92 | Wt 280.0 lb

## 2024-06-28 DIAGNOSIS — Z8759 Personal history of other complications of pregnancy, childbirth and the puerperium: Secondary | ICD-10-CM

## 2024-06-28 DIAGNOSIS — Z3689 Encounter for other specified antenatal screening: Secondary | ICD-10-CM

## 2024-06-28 DIAGNOSIS — Z3480 Encounter for supervision of other normal pregnancy, unspecified trimester: Secondary | ICD-10-CM | POA: Insufficient documentation

## 2024-06-28 NOTE — Progress Notes (Signed)
 New OB Intake  I connected with  Tiffany Watkins on 06/28/24 at  8:15 AM EDT by in office visit.  I explained I am completing New OB Intake today. We discussed her EDD of 02/02/25 that is based on LMP of 04/28/24. Pt is G2/P0. I reviewed her allergies, medications, Medical/Surgical/OB history, and appropriate screenings. There are cats in the home: no. Based on history, this is a/an pregnancy uncomplicated . Her obstetrical history is significant for N/A.  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  Anatomy US  Explained first scheduled US  will be 07/10/24. Anatomy US  will be scheduled around [redacted] weeks gestational age.  Labs Discussed genetic screening with patient. Patient desires genetic testing to be drawn at new OB visit. Discussed possible labs to be drawn at new OB appointment.  COVID Vaccine Patient has had 3 COVID vaccines.   Social Determinants of Health Food Insecurity: denies food insecurity Transportation: Patient denies transportation needs. Childcare: Discussed no children allowed at ultrasound appointments.   First visit review I reviewed new OB appt with pt. I explained she will have blood work and pap smear/pelvic exam if indicated. Explained pt will be seen by Harlene Cisco, CNM at first visit; encounter routed to appropriate provider.   Beola Skeens, CMA 06/28/2024  8:39 AM

## 2024-06-28 NOTE — Patient Instructions (Signed)
 First Trimester of Pregnancy  The first trimester of pregnancy starts on the first day of your last monthly period until the end of week 13. This is months 1 through 3 of pregnancy. A week after a sperm fertilizes an egg, the egg will implant into the wall of the uterus and begin to develop into a baby. Body changes during your first trimester Your body goes through many changes during pregnancy. The changes usually return to normal after your baby is born. Physical changes Your breasts may grow larger and may hurt. The area around your nipples may get darker. Your periods will stop. Your hair and nails may grow faster. You may pee more often. Health changes You may tire easily. Your gums may bleed and may be sensitive when you brush and floss. You may not feel hungry. You may have heartburn. You may throw up or feel like you may throw up. You may want to eat some foods, but not others. You may have headaches. You may have trouble pooping (constipation). Other changes Your emotions may change from day to day. You may have more dreams. Follow these instructions at home: Medicines Talk to your health care provider if you're taking medicines. Ask if the medicines are safe to take during pregnancy. Your provider may change the medicines that you take. Do not take any medicines unless told to by your provider. Take a prenatal vitamin that has at least 600 micrograms (mcg) of folic acid. Do not use herbal medicines, illegal substances, or medicines that are not approved by your provider. Eating and drinking While you're pregnant your body needs extra food for your growing baby. Talk with your provider about what to eat while pregnant. Activity Most women are able to exercise during pregnancy. Exercises may need to change as your pregnancy goes on. Talk to your provider about your activities and exercise routines. Relieving pain and discomfort Wear a good, supportive bra if your breasts  hurt. Rest with your legs raised if you have leg cramps or low back pain. Safety Wear your seatbelt at all times when you're in a car. Talk to your provider if someone hits you, hurts you, or yells at you. Talk with your provider if you're feeling sad or have thoughts of hurting yourself. Lifestyle Certain things can be harmful while you're pregnant. Follow these rules: Do not use hot tubs, steam rooms, or saunas. Do not douche. Do not use tampons or scented pads. Do not drink alcohol,smoke, vape, or use products with nicotine or tobacco in them. If you need help quitting, talk with your provider. Avoid cat litter boxes and soil used by cats. These things carry germs that can cause harm to your pregnancy and your baby. General instructions Keep all follow-up visits. It helps you and your unborn baby stay as healthy as possible. Write down your questions. Take them to your visits. Your provider will: Talk with you about your overall health. Give you advice or refer you to specialists who can help with different needs, including: Prenatal education classes. Mental health and counseling. Foods and healthy eating. Ask for help if you need help with food. Call your dentist and ask to be seen. Brush your teeth with a soft toothbrush. Floss gently. Where to find more information American Pregnancy Association: americanpregnancy.org [employer]: acog.org Office on [employer] Health: [employer] Contact a health care provider if: You feel dizzy, faint, or have a fever. You vomit or have watery poop (diarrhea) for 2  days or more. You have abnormal discharge or bleeding from your vagina. You have pain when you pee or your pee smells bad. You have cramps, pain, or pressure in your belly area. Get help right away if: You have trouble breathing or chest pain. You have any kind of injury, such as from a fall or a car crash. These symptoms may be an  emergency. Get help right away. Call 911. Do not wait to see if the symptoms will go away. Do not drive yourself to the hospital. This information is not intended to replace advice given to you by your health care provider. Make sure you discuss any questions you have with your health care provider. Document Revised: 07/27/2023 Document Reviewed: 02/24/2023 Elsevier Patient Education  2024 Elsevier Inc.   Common Medications Safe in Pregnancy  Acne:      Constipation:  Benzoyl Peroxide     Colace  Clindamycin      Dulcolax Suppository  Topica Erythromycin     Fibercon  Salicylic Acid      Metamucil         Miralax AVOID:        Senakot   Accutane    Cough:  Retin-A       Cough Drops  Tetracycline      Phenergan w/ Codeine if Rx  Minocycline      Robitussin (Plain & DM)  Antibiotics:     Crabs/Lice:  Ceclor       RID  Cephalosporins    AVOID:  E-Mycins      Kwell  Keflex  Macrobid/Macrodantin   Diarrhea:  Penicillin      Kao-Pectate  Zithromax      Imodium AD         PUSH FLUIDS AVOID:       Cipro     Fever:  Tetracycline      Tylenol (Regular or Extra  Minocycline       Strength)  Levaquin      Extra Strength-Do not          Exceed 8 tabs/24 hrs Caffeine:        200mg /day (equiv. To 1 cup of coffee or  approx. 3 12 oz sodas)         Gas: Cold/Hayfever:       Gas-X  Benadryl      Mylicon  Claritin       Phazyme  **Claritin-D        Chlor-Trimeton    Headaches:  Dimetapp      ASA-Free Excedrin  Drixoral-Non-Drowsy     Cold Compress  Mucinex (Guaifenasin)     Tylenol (Regular or Extra  Sudafed/Sudafed-12 Hour     Strength)  **Sudafed PE Pseudoephedrine   Tylenol Cold & Sinus     Vicks Vapor Rub  Zyrtec  **AVOID if Problems With Blood Pressure         Heartburn: Avoid lying down for at least 1 hour after meals  Aciphex      Maalox     Rash:  Milk of Magnesia     Benadryl    Mylanta       1% Hydrocortisone Cream  Pepcid  Pepcid Complete   Sleep  Aids:  Prevacid      Ambien   Prilosec       Benadryl  Rolaids       Chamomile Tea  Tums (Limit 4/day)     Unisom  Tylenol PM         Warm milk-add vanilla or  Hemorrhoids:       Sugar for taste  Anusol/Anusol H.C.  (RX: Analapram 2.5%)  Sugar Substitutes:  Hydrocortisone OTC     Ok in moderation  Preparation H      Tucks        Vaseline lotion applied to tissue with wiping    Herpes:     Throat:  Acyclovir      Oragel  Famvir  Valtrex     Vaccines:         Flu Shot Leg Cramps:       *Gardasil  Benadryl      Hepatitis A         Hepatitis B Nasal Spray:       Pneumovax  Saline Nasal Spray     Polio Booster         Tetanus Nausea:       Tuberculosis test or PPD  Vitamin B6 25 mg TID   AVOID:    Dramamine      *Gardasil  Emetrol       Live Poliovirus  Ginger Root 250 mg QID    MMR (measles, mumps &  High Complex Carbs @ Bedtime    rebella)  Sea Bands-Accupressure    Varicella (Chickenpox)  Unisom 1/2 tab TID     *No known complications           If received before Pain:         Known pregnancy;   Darvocet       Resume series after  Lortab        Delivery  Percocet    Yeast:   Tramadol      Femstat  Tylenol 3      Gyne-lotrimin  Ultram       Monistat  Vicodin           MISC:         All Sunscreens           Hair Coloring/highlights          Insect Repellant's          (Including DEET)         Mystic Tans   Commonly Asked Questions During Pregnancy   Cats: A parasite can be excreted in cat feces.  To avoid exposure you need to have another person empty the little box.  If you must empty the litter box you will need to wear gloves.  Wash your hands after handling your cat.  This parasite can also be found in raw or undercooked meat so this should also be avoided.  Colds, Sore Throats, Flu: Please check your medication sheet to see what you can take for symptoms.  If your symptoms are unrelieved by these medications please call the office.  Dental Work: Most  any dental work Agricultural consultant recommends is permitted.  X-rays should only be taken during the first trimester if absolutely necessary.  Your abdomen should be shielded with a lead apron during all x-rays.  Please notify your provider prior to receiving any x-rays.  Novocaine is fine; gas is not recommended.  If your dentist requires a note from [place] prior to dental work please call the office and we will provide one for you.  Exercise: Exercise is an important part of staying healthy during your pregnancy.  You may continue most exercises you were accustomed to prior to pregnancy.  Later in your pregnancy you will most likely notice you have difficulty with activities requiring balance like riding a bicycle.  It is important that you listen to your body and avoid activities that put you at a higher risk of falling.  Adequate rest and staying well hydrated are a must!  If you have questions about the safety of specific activities ask your provider.    Exposure to Children with illness: Try to avoid obvious exposure; report any symptoms to [place] when noted,  If you have chicken pos, red measles or mumps, you should be immune to these diseases.   Please do not take any vaccines while pregnant unless you have checked with your OB provider.  Fetal Movement: After 28 weeks we recommend you do "kick counts" twice daily.  Lie or sit down in a calm quiet environment and count your baby movements "kicks".  You should feel your baby at least 10 times per hour.  If you have not felt 10 kicks within the first hour get up, walk around and have something sweet to eat or drink then repeat for an additional hour.  If count remains less than 10 per hour notify your provider.  Fumigating: Follow your pest control agent's advice as to how long to stay out of your home.  Ventilate the area well before re-entering.  Hemorrhoids:   Most over-the-counter preparations can be used during pregnancy.  Check your medication to see what is  safe to use.  It is important to use a stool softener or fiber in your diet and to drink lots of liquids.  If hemorrhoids seem to be getting worse please call the office.   Hot Tubs:  Hot tubs Jacuzzis and saunas are not recommended while pregnant.  These increase your internal body temperature and should be avoided.  Intercourse:  Sexual intercourse is safe during pregnancy as long as you are comfortable, unless otherwise advised by your provider.  Spotting may occur after intercourse; report any bright red bleeding that is heavier than spotting.  Labor:  If you know that you are in labor, please go to the hospital.  If you are unsure, please call the office and let us help you decide what to do.  Lifting, straining, etc:  If your job requires heavy lifting or straining please check with your provider for any limitations.  Generally, you should not lift items heavier than that you can lift simply with your hands and arms (no back muscles)  Painting:  Paint fumes do not harm your pregnancy, but may make you ill and should be avoided if possible.  Latex or water based paints have less odor than oils.  Use adequate ventilation while painting.  Permanents & Hair Color:  Chemicals in hair dyes are not recommended as they cause increase hair dryness which can increase hair loss during pregnancy.  " Highlighting" and permanents are allowed.  Dye may be absorbed differently and permanents may not hold as well during pregnancy.  Sunbathing:  Use a sunscreen, as skin burns easily during pregnancy.  Drink plenty of fluids; avoid over heating.  Tanning Beds:  Because their possible side effects are still unknown, tanning beds are not recommended.  Ultrasound Scans:  Routine ultrasounds are performed at approximately 20 weeks.  You will be able to see your baby's general anatomy an if you would like to know the gender this can usually be determined as well.  If it is questionable when you conceived you may  also  receive an ultrasound early in your pregnancy for dating purposes.  Otherwise ultrasound exams are not routinely performed unless there is a medical necessity.  Although you can request a scan we ask that you pay for it when conducted because insurance does not cover " patient request" scans.  Work: If your pregnancy proceeds without complications you may work until your due date, unless your physician or employer advises otherwise.  Round Ligament Pain/Pelvic Discomfort:  Sharp, shooting pains not associated with bleeding are fairly common, usually occurring in the second trimester of pregnancy.  They tend to be worse when standing up or when you remain standing for long periods of time.  These are the result of pressure of certain pelvic ligaments called "round ligaments".  Rest, Tylenol and heat seem to be the most effective relief.  As the womb and fetus grow, they rise out of the pelvis and the discomfort improves.  Please notify the office if your pain seems different than that described.  It may represent a more serious condition.

## 2024-07-10 ENCOUNTER — Ambulatory Visit
Admission: RE | Admit: 2024-07-10 | Discharge: 2024-07-10 | Disposition: A | Discharge status: Home / Self Care | Source: Ambulatory Visit | Attending: Obstetrics | Admitting: Obstetrics

## 2024-07-10 DIAGNOSIS — Z3200 Encounter for pregnancy test, result unknown: Secondary | ICD-10-CM | POA: Diagnosis present

## 2024-07-10 DIAGNOSIS — Z3687 Encounter for antenatal screening for uncertain dates: Secondary | ICD-10-CM | POA: Insufficient documentation

## 2024-07-16 ENCOUNTER — Other Ambulatory Visit: Payer: Self-pay | Admitting: Obstetrics

## 2024-07-16 ENCOUNTER — Encounter: Payer: Self-pay | Admitting: Obstetrics

## 2024-07-23 ENCOUNTER — Other Ambulatory Visit

## 2024-07-23 ENCOUNTER — Encounter: Admitting: Certified Nurse Midwife

## 2024-08-05 ENCOUNTER — Telehealth: Payer: Self-pay

## 2024-08-05 NOTE — Telephone Encounter (Signed)
 Patient states she had some spotting on Friday that was pink at first and has continued, but only when she wipes. She states it has been brown since Friday. She states last intercourse was Wednesday. Advised this can be normal. Monitor the spotting, notify us  if she the bleeding is bright red, increases or she begins to have cramping uncontrolled by tylenol .

## 2024-08-07 ENCOUNTER — Telehealth: Payer: Self-pay

## 2024-08-07 NOTE — Telephone Encounter (Signed)
 Patient asking if spotting was normal I read message from previous 08/05/24 triage she sated she is having some clots but not big no cramping and bleeding is on and off but is more like spotting, I let her know the bleeding protocol and when to go to ed but ultimately ts her decision to go to ed and can anytime for reassurance.

## 2024-08-09 ENCOUNTER — Emergency Department
Admission: EM | Admit: 2024-08-09 | Discharge: 2024-08-09 | Disposition: A | Discharge status: Home / Self Care | Attending: Emergency Medicine | Admitting: Emergency Medicine

## 2024-08-09 ENCOUNTER — Emergency Department

## 2024-08-09 ENCOUNTER — Other Ambulatory Visit: Payer: Self-pay

## 2024-08-09 DIAGNOSIS — Z3A11 11 weeks gestation of pregnancy: Secondary | ICD-10-CM | POA: Insufficient documentation

## 2024-08-09 DIAGNOSIS — O034 Incomplete spontaneous abortion without complication: Secondary | ICD-10-CM | POA: Diagnosis not present

## 2024-08-09 DIAGNOSIS — O209 Hemorrhage in early pregnancy, unspecified: Secondary | ICD-10-CM | POA: Diagnosis present

## 2024-08-09 DIAGNOSIS — O4690 Antepartum hemorrhage, unspecified, unspecified trimester: Secondary | ICD-10-CM

## 2024-08-09 LAB — URINALYSIS, ROUTINE W REFLEX MICROSCOPIC
Bacteria, UA: NONE SEEN
Bilirubin Urine: NEGATIVE
Glucose, UA: NEGATIVE mg/dL
Ketones, ur: NEGATIVE mg/dL
Leukocytes,Ua: NEGATIVE
Nitrite: NEGATIVE
Protein, ur: NEGATIVE mg/dL
Specific Gravity, Urine: 1.025 (ref 1.005–1.030)
pH: 5 (ref 5.0–8.0)

## 2024-08-09 LAB — BASIC METABOLIC PANEL WITH GFR
Anion gap: 12 (ref 5–15)
BUN: 11 mg/dL (ref 6–20)
CO2: 25 mmol/L (ref 22–32)
Calcium: 9.1 mg/dL (ref 8.9–10.3)
Chloride: 101 mmol/L (ref 98–111)
Creatinine, Ser: 0.74 mg/dL (ref 0.44–1.00)
GFR, Estimated: >60 mL/min (ref >60)
Glucose, Bld: 112 mg/dL — ABNORMAL HIGH (ref 70–99)
Potassium: 4.5 mmol/L (ref 3.5–5.1)
Sodium: 138 mmol/L (ref 135–145)

## 2024-08-09 LAB — CBC WITH DIFFERENTIAL/PLATELET
Abs Immature Granulocytes: 0.06 K/uL (ref 0.00–0.07)
Basophils Absolute: 0.1 K/uL (ref 0.0–0.1)
Basophils Relative: 1 %
Eosinophils Absolute: 0.2 K/uL (ref 0.0–0.5)
Eosinophils Relative: 2 %
HCT: 40.8 % (ref 36.0–46.0)
Hemoglobin: 13.7 g/dL (ref 12.0–15.0)
Immature Granulocytes: 1 %
Lymphocytes Relative: 35 %
Lymphs Abs: 4.7 K/uL — ABNORMAL HIGH (ref 0.7–4.0)
MCH: 28.7 pg (ref 26.0–34.0)
MCHC: 33.6 g/dL (ref 30.0–36.0)
MCV: 85.5 fL (ref 80.0–100.0)
Monocytes Absolute: 0.6 K/uL (ref 0.1–1.0)
Monocytes Relative: 5 %
Neutro Abs: 7.7 K/uL (ref 1.7–7.7)
Neutrophils Relative %: 56 %
Platelets: 303 K/uL (ref 150–400)
RBC: 4.77 MIL/uL (ref 3.87–5.11)
RDW: 12.9 % (ref 11.5–15.5)
Smear Review: NORMAL
WBC: 13 K/uL — ABNORMAL HIGH (ref 4.0–10.5)
nRBC: 0 % (ref 0.0–0.2)

## 2024-08-09 LAB — POC URINE PREG, ED: Preg Test, Ur: POSITIVE — AB

## 2024-08-09 NOTE — Discharge Instructions (Signed)
 As we discussed, follow-up with OB/GYN early next week, it might be helpful to call them and let them know what happens they can see you sooner such as on Monday.  If you develop fevers, passing out, uncontrolled pain or bleeding the please return to the ED.

## 2024-08-09 NOTE — ED Notes (Signed)
 Spoke to lab regarding Hcg add on

## 2024-08-09 NOTE — ED Provider Notes (Signed)
 [facility] Provider Note    Event Date/Time   First MD Initiated Contact with Patient 08/09/24 2138     (approximate)   History   Vaginal Bleeding   HPI  Tiffany Watkins is a 33 y.o. female who presents to the ED for evaluation of Vaginal Bleeding   Reviewed previous type and screen's from March.  Rh+  G2 at about [redacted] weeks gestation presents to the ED with painless vaginal bleeding.  Reports of light spotting over the past week or so and developed more brisk bright red blood vaginal bleeding with clots today.  No pain or cramping.  No emesis or fevers or other concerns   Physical Exam   Triage Vital Signs: ED Triage Vitals [08/09/24 2115]  Encounter Vitals Group     BP (!) 150/103     Girls Systolic BP Percentile      Girls Diastolic BP Percentile      Boys Systolic BP Percentile      Boys Diastolic BP Percentile      Pulse Rate (!) 112     Resp 20     Temp 99 F (37.2 C)     Temp Source Oral     SpO2 100 %     Weight 275 lb (124.7 kg)     Height 5' 6 (1.676 m)     Head Circumference      Peak Flow      Pain Score 0     Pain Loc      Pain Education      Exclude from Growth Chart     Most recent vital signs: Vitals:   08/09/24 2138 08/09/24 2300  BP:  122/86  Pulse:  (!) 106  Resp:    Temp:    SpO2: 100% 99%    General: Awake, no distress.  CV:  Good peripheral perfusion.  Resp:  Normal effort.  Abd:  No distention.  Soft and nontender MSK:  No deformity noted.  Neuro:  No focal deficits appreciated. Other:     ED Results / Procedures / Treatments   Labs (all labs ordered are listed, but only abnormal results are displayed) Labs Reviewed  BASIC METABOLIC PANEL WITH GFR - Abnormal; Notable for the following components:      Result Value   Glucose, Bld 112 (*)    All other components within normal limits  CBC WITH DIFFERENTIAL/PLATELET - Abnormal; Notable for the following components:   WBC 13.0 (*)    Lymphs Abs  4.7 (*)    All other components within normal limits  URINALYSIS, ROUTINE W REFLEX MICROSCOPIC - Abnormal; Notable for the following components:   Color, Urine YELLOW (*)    APPearance CLEAR (*)    Hgb urine dipstick MODERATE (*)    All other components within normal limits  POC URINE PREG, ED - Abnormal; Notable for the following components:   Preg Test, Ur POSITIVE (*)    All other components within normal limits  HCG, QUANTITATIVE, PREGNANCY    EKG   RADIOLOGY Obstetric ultrasound interpreted by me with IUP but unfortunately no fetal cardiac activity  Official radiology report(s): US  OB LESS THAN 14 WEEKS WITH OB TRANSVAGINAL Result Date: 08/09/2024 CLINICAL DATA:  Vaginal bleeding EXAM: OBSTETRIC <14 WK US  AND TRANSVAGINAL OB US  TECHNIQUE: Both transabdominal and transvaginal ultrasound examinations were performed for complete evaluation of the gestation as well as the maternal uterus, adnexal regions, and pelvic cul-de-sac. Transvaginal technique was performed  to assess early pregnancy. COMPARISON:  Ultrasound 07/10/2024 FINDINGS: Intrauterine gestational sac: Single irregular intrauterine gestational sac. Yolk sac:  Not visualized Embryo:  Visualized Cardiac Activity: Not visualized CRL:  10.2 mm   7 w   4 d                  US  EDC: 03/27/2025 Subchorionic hemorrhage:  None visualized. Maternal uterus/adnexae: Ovaries are within normal limits. Right ovary measures 2.1 by 2.3 x 1.8 cm. Left ovary measures 2.2 x 1.9 x 1.6 cm. No significant free fluid IMPRESSION: 1. Single intrauterine gestation with visualized embryo, however no fetal cardiac activity is visualized, concerning for interval fetal demise. Electronically Signed   By: Luke Bun M.D.   On: 08/09/2024 23:00    PROCEDURES and INTERVENTIONS:  Procedures  Medications - No data to display   IMPRESSION / MDM / ASSESSMENT AND PLAN / ED COURSE  I reviewed the triage vital signs and the nursing notes.  Differential  diagnosis includes, but is not limited to, miscarriage, PID, subchorionic hematoma, placenta previa  {Patient presents with symptoms of an acute illness or injury that is potentially life-threatening.  Patient presents with painless vaginal bleeding in the first trimester.  Nontender exam.  Rh+ without indication for RhoGAM.  Normal hemoglobin.  Obtain a vaginal ultrasound and reassess.  As above, ultrasound unfortunately without signs of cardiac activity.  I discussed this with the patient and inevitable nature that she would likely miscarry the remaining tissue.  She has upcoming appointment on Tuesday with OB/GYN.  We discussed following up with them and ED return precautions in the meantime.  Patient suitable for outpatient management  Clinical Course as of 08/09/24 2323  [name] Aug 09, 2024  2321 Reassessed and updated patient of ultrasound results without evidence of fetal cardiac activity.  She is understandably upset.  Continues to have no pain.  We discussed inevitable miscarriage.  She has follow-up on Tuesday with OB/GYN, we discussed reaching out to them to see if they can see her any sooner.  We discussed what to expect at home and ED return precautions.  She is comfortable going home. [DS]    Clinical Course User Index [DS] Claudene Rover, MD     FINAL CLINICAL IMPRESSION(S) / ED DIAGNOSES   Final diagnoses:  Vaginal bleeding in pregnancy  Inevitable abortion     Rx / DC Orders   ED Discharge Orders     None        Note:  This document was prepared using Dragon voice recognition software and may include unintentional dictation errors.   Claudene Rover, MD 08/09/24 [phone]

## 2024-08-09 NOTE — ED Triage Notes (Signed)
 Patient brought in by husband for vaginal bleeding, [redacted] weeks pregnant. Light dark spotting since Friday, this evening bleeding increased and turned bright red, pt saturated linier in 30 minutes. AxOx4, ambulatory with steady gait, denies pain.

## 2024-08-12 ENCOUNTER — Ambulatory Visit: Admitting: Licensed Practical Nurse

## 2024-08-12 ENCOUNTER — Telehealth: Payer: Self-pay

## 2024-08-12 ENCOUNTER — Emergency Department

## 2024-08-12 ENCOUNTER — Other Ambulatory Visit: Payer: Self-pay

## 2024-08-12 ENCOUNTER — Emergency Department
Admission: EM | Admit: 2024-08-12 | Discharge: 2024-08-13 | Disposition: A | Discharge status: Home / Self Care | Attending: Emergency Medicine | Admitting: Emergency Medicine

## 2024-08-12 ENCOUNTER — Encounter: Payer: Self-pay | Admitting: Licensed Practical Nurse

## 2024-08-12 VITALS — BP 115/82 | HR 97 | Ht 66.0 in | Wt 288.3 lb

## 2024-08-12 DIAGNOSIS — Z3A00 Weeks of gestation of pregnancy not specified: Secondary | ICD-10-CM | POA: Insufficient documentation

## 2024-08-12 DIAGNOSIS — O4690 Antepartum hemorrhage, unspecified, unspecified trimester: Secondary | ICD-10-CM

## 2024-08-12 DIAGNOSIS — Z3A01 Less than 8 weeks gestation of pregnancy: Secondary | ICD-10-CM

## 2024-08-12 DIAGNOSIS — D72829 Elevated white blood cell count, unspecified: Secondary | ICD-10-CM | POA: Insufficient documentation

## 2024-08-12 DIAGNOSIS — O209 Hemorrhage in early pregnancy, unspecified: Secondary | ICD-10-CM | POA: Diagnosis present

## 2024-08-12 DIAGNOSIS — O021 Missed abortion: Secondary | ICD-10-CM | POA: Diagnosis not present

## 2024-08-12 DIAGNOSIS — O99119 Other diseases of the blood and blood-forming organs and certain disorders involving the immune mechanism complicating pregnancy, unspecified trimester: Secondary | ICD-10-CM | POA: Insufficient documentation

## 2024-08-12 LAB — CBC WITH DIFFERENTIAL/PLATELET
Abs Immature Granulocytes: 0.05 K/uL (ref 0.00–0.07)
Basophils Absolute: 0.1 K/uL (ref 0.0–0.1)
Basophils Relative: 1 %
Eosinophils Absolute: 0.2 K/uL (ref 0.0–0.5)
Eosinophils Relative: 2 %
HCT: 40 % (ref 36.0–46.0)
Hemoglobin: 13.5 g/dL (ref 12.0–15.0)
Immature Granulocytes: 0 %
Lymphocytes Relative: 36 %
Lymphs Abs: 4.5 K/uL — ABNORMAL HIGH (ref 0.7–4.0)
MCH: 28.8 pg (ref 26.0–34.0)
MCHC: 33.8 g/dL (ref 30.0–36.0)
MCV: 85.3 fL (ref 80.0–100.0)
Monocytes Absolute: 0.5 K/uL (ref 0.1–1.0)
Monocytes Relative: 4 %
Neutro Abs: 7.1 K/uL (ref 1.7–7.7)
Neutrophils Relative %: 57 %
Platelets: 311 K/uL (ref 150–400)
RBC: 4.69 MIL/uL (ref 3.87–5.11)
RDW: 12.8 % (ref 11.5–15.5)
Smear Review: NORMAL
WBC: 12.5 K/uL — ABNORMAL HIGH (ref 4.0–10.5)
nRBC: 0 % (ref 0.0–0.2)

## 2024-08-12 LAB — URINALYSIS, ROUTINE W REFLEX MICROSCOPIC
Bacteria, UA: NONE SEEN
RBC / HPF: >50 RBC/hpf (ref 0–5)
Squamous Epithelial / HPF: 0 /HPF (ref 0–5)
WBC, UA: >50 WBC/hpf (ref 0–5)

## 2024-08-12 LAB — HCG, QUANTITATIVE, PREGNANCY: hCG, Beta Chain, Quant, S: 5182 m[IU]/mL — ABNORMAL HIGH (ref <5)

## 2024-08-12 MED ORDER — MISOPROSTOL 200 MCG PO TABS: ORAL_TABLET | ORAL | 1 refills | Status: DC

## 2024-08-12 MED ORDER — LACTATED RINGERS IV BOLUS
500.0000 mL | Freq: Once | INTRAVENOUS | Status: AC
  Administered 2024-08-12: 500 mL via INTRAVENOUS

## 2024-08-12 MED ORDER — KETOROLAC TROMETHAMINE 30 MG/ML IJ SOLN
30.0000 mg | Freq: Once | INTRAMUSCULAR | Status: AC
  Administered 2024-08-12: 30 mg via INTRAVENOUS
  Filled 2024-08-12: qty 1

## 2024-08-12 MED ORDER — OXYCODONE HCL 5 MG PO TABS: 5.0000 mg | ORAL_TABLET | Freq: Four times a day (QID) | ORAL | 0 refills | Status: DC | PRN

## 2024-08-12 MED ORDER — OXYCODONE-ACETAMINOPHEN 5-325 MG PO TABS
2.0000 | ORAL_TABLET | Freq: Once | ORAL | Status: AC
  Administered 2024-08-12: 2 via ORAL
  Filled 2024-08-12: qty 2

## 2024-08-12 MED ORDER — MISOPROSTOL 200 MCG PO TABS
800.0000 ug | ORAL_TABLET | Freq: Once | ORAL | Status: AC
  Administered 2024-08-13: 800 ug via BUCCAL
  Filled 2024-08-12: qty 4

## 2024-08-12 MED ORDER — KETOROLAC TROMETHAMINE 30 MG/ML IJ SOLN: 30.0000 mg | Freq: Once | INTRAMUSCULAR | Status: DC

## 2024-08-12 NOTE — Telephone Encounter (Signed)
 Chart reviewed. Patient reported to ED as advised.

## 2024-08-12 NOTE — ED Provider Notes (Signed)
 [facility] Provider Note    Event Date/Time   First MD Initiated Contact with Patient 08/12/24 2206     (approximate)   History   Vaginal Bleeding   HPI  Tiffany Watkins is a 33 year old female presenting to the emergency department for evaluation of vaginal bleeding.  Patient was seen in our ER on Friday in the setting of vaginal bleeding in pregnancy.  Ultrasound at that time demonstrated IUP but with about fetal cardiac activity which was previously noted concerning for inevitable miscarriage.  Reviewed documentation from clinic visit earlier today.  Was administered Cytotec that she reports she took around 3.  Around 730pm, she had onset of passage of clots with vaginal bleeding.  Reports heavy vaginal bleeding with saturation of the pad within a few minutes and thinks that she has had persistent heavy bleeding since that time.  No syncope.  Does report some lower abdominal cramping for which she took oxycodone  at home.    Physical Exam   Triage Vital Signs: ED Triage Vitals [08/12/24 2115]  Encounter Vitals Group     BP (!) 147/103     Girls Systolic BP Percentile      Girls Diastolic BP Percentile      Boys Systolic BP Percentile      Boys Diastolic BP Percentile      Pulse Rate (!) 102     Resp 20     Temp 98.3 F (36.8 C)     Temp Source Oral     SpO2 99 %     Weight 288 lb (130.6 kg)     Height 5' 6 (1.676 m)     Head Circumference      Peak Flow      Pain Score 6     Pain Loc      Pain Education      Exclude from Growth Chart     Most recent vital signs: Vitals:   08/12/24 2154 08/12/24 2200  BP:  109/65  Pulse:  98  Resp:    Temp:    SpO2: 98% 98%     General: Awake, interactive  CV:  Good peripheral perfusion Resp:  Unlabored respirations Abd:  Nondistended, soft, mild tenderness over the lower abdomen GU:  Moderate to large clot and tissue volume noted within the vaginal vault.  There is some tissue noted at the  cervix that I was unable to fully dislodge. Neuro:  Symmetric facial movement, fluid speech   ED Results / Procedures / Treatments   Labs (all labs ordered are listed, but only abnormal results are displayed) Labs Reviewed  CBC WITH DIFFERENTIAL/PLATELET - Abnormal; Notable for the following components:      Result Value   WBC 12.5 (*)    Lymphs Abs 4.5 (*)    All other components within normal limits  HCG, QUANTITATIVE, PREGNANCY - Abnormal; Notable for the following components:   hCG, Beta Chain, Quant, S 5,182 (*)    All other components within normal limits  URINALYSIS, ROUTINE W REFLEX MICROSCOPIC - Abnormal; Notable for the following components:   Color, Urine RED (*)    APPearance TURBID (*)    Glucose, UA   (*)    Value: TEST NOT REPORTED DUE TO COLOR INTERFERENCE OF URINE PIGMENT   Hgb urine dipstick   (*)    Value: TEST NOT REPORTED DUE TO COLOR INTERFERENCE OF URINE PIGMENT   Bilirubin Urine   (*)    Value: TEST  NOT REPORTED DUE TO COLOR INTERFERENCE OF URINE PIGMENT   Ketones, ur   (*)    Value: TEST NOT REPORTED DUE TO COLOR INTERFERENCE OF URINE PIGMENT   Protein, ur   (*)    Value: TEST NOT REPORTED DUE TO COLOR INTERFERENCE OF URINE PIGMENT   Nitrite   (*)    Value: TEST NOT REPORTED DUE TO COLOR INTERFERENCE OF URINE PIGMENT   Leukocytes,Ua   (*)    Value: TEST NOT REPORTED DUE TO COLOR INTERFERENCE OF URINE PIGMENT   All other components within normal limits     EKG EKG independently reviewed and interpreted by myself demonstrates:    RADIOLOGY Imaging independently reviewed and interpreted by myself demonstrates:   Formal Radiology Read:  No results found.  PROCEDURES:  Critical Care performed: No  Procedures   MEDICATIONS ORDERED IN ED: Medications  misoprostol (CYTOTEC) tablet 800 mcg (has no administration in time range)  lactated ringers bolus 500 mL (has no administration in time range)  ketorolac  (TORADOL ) 30 MG/ML injection 30 mg  (has no administration in time range)  oxyCODONE -acetaminophen  (PERCOCET/ROXICET) 5-325 MG per tablet 2 tablet (2 tablets Oral Given 08/12/24 2247)     IMPRESSION / MDM / ASSESSMENT AND PLAN / ED COURSE  I reviewed the triage vital signs and the nursing notes.  Differential diagnosis includes, but is not limited to, vaginal bleeding in the setting of ongoing miscarriage, completed miscarriage, medication adverse effect  Patient's presentation is most consistent with acute presentation with potential threat to life or bodily function.  33 year old female presenting with heavy vaginal bleeding after taking Cytotec in the setting of inevitable miscarriage.  Hemodynamically stable on presentation.  Labs with stable hemoglobin at 13.5, mild leukocytosis.  hCG downtrending as expected.  UA with significant presence of blood, limited interpretation.  Does have continued clot passage on my pelvic exam here.  Will discuss with OB/GYN team.  Clinical Course as of 08/12/24 2322  Mon Aug 12, 2024  2219 Case reviewed with CNM Eleanor Canny with [facility]. She will evaluate the patient at bedside to provide further recommendations. Tentative plan for repeat pelvic to clear clot as able, likely ultrasound to evaluate for retained products vs completed miscarriage.  [NR]  2315 Received update from [facility].  She was able to remove some clot on her exam, but patient did have more heavy bleeding following this.  Would like to monitor the patient for another 1 to 2 hours to watch amount of bleeding.  If significantly improved, may be able to fully discharge, but if has persistent bleeding may require D&C.  Does also recommend ultrasound for further evaluation.  Signed out to oncoming physician pending reassessment, ultrasound, disposition. [NR]    Clinical Course User Index [NR] Levander Slate, MD     FINAL CLINICAL IMPRESSION(S) / ED DIAGNOSES   Final diagnoses:  Vaginal bleeding in pregnancy     Rx  / DC Orders   ED Discharge Orders     None        Note:  This document was prepared using Dragon voice recognition software and may include unintentional dictation errors.   Levander Slate, MD 08/12/24 [phone]

## 2024-08-12 NOTE — ED Provider Notes (Signed)
-----------------------------------------   11:20 PM on 08/12/2024 -----------------------------------------  Blood pressure 109/65, pulse 98, temperature 98.3 F (36.8 C), temperature source Oral, resp. rate 20, height 5' 6 (1.676 m), weight 130.6 kg, last menstrual period 04/28/2024, SpO2 98%.  Assuming care from Dr. Levander.  In short, Tiffany Watkins is a 33 y.o. female with a chief complaint of Vaginal Bleeding .  Refer to the original H&P for additional details.  The current plan of care is to reassess bleeding following cytotec and repeat US .  ----------------------------------------- 3:09 AM on 08/13/2024 ----------------------------------------- Pelvic ultrasound shows some thickened endometrium however gestational sac has cleared.  Patient was reevaluated by [name] with improvement in bleeding.  She recommends observation for a another couple of hours and if bleeding remains stable, then patient would be appropriate for discharge home.  Patient has been observed with stable bleeding and is appropriate for discharge home with close OB/GYN follow-up.  She was counseled to return to the ED for new or worsening symptoms, patient agrees with plan.   [name], MD 08/13/24 [phone]

## 2024-08-12 NOTE — ED Triage Notes (Signed)
 Patient brought in by husband for vaginal bleeding post miscarriage. Patient prescribed cytotec by midwife, took dose 1500. Cramping starting 1545, bleeding began 1945. Patient states she's been soaking through a pad every 5 minutes since 1945. Oxycodone  taken at 1800 and 1930 without relief. AxOx4, ambulatory to triage, current pain 6/10.

## 2024-08-12 NOTE — Telephone Encounter (Signed)
 SABRA

## 2024-08-12 NOTE — Patient Instructions (Signed)
  CYTOTEC (MISOPROSTOL) INSTRUCTIONS   Who should not take this medication?  . If you have an allergy to Cytotec   What are the side effects of the Cytotec?  . Menstrual cramping or contractions . Vaginal bleeding-soaking a maxi pad within 1 hour is considered excessive bleeding and you should go to the emergency room for further evaluation . Headache . Abdominal pain . Nausea or vomiting.  Small frequent meals, sucking on hard sugar-free candy, or chewing sugar-free gum may help.  . Diarrhea   How do I take Cytotec?  Once the pregnancy has remained to be an.,  He has 3 options for treatment.  You have chosen medical management.  You will be given a prescription for Cytotec 800 mcg in four 200 mcg.  We will place 4 tablets within your vagina (as high in your vagina as she can place them) and you may place a tampon (or just lie down) to ensure the pills do not fall out.  Your pregnancy will begin to pass in about 8 to 12 hours after placement of the tablets.  Cramping, abdominal pain, and bleeding is common.  This is how the medication works.  You may take ibuprofen 800 mg every 8 hours for mild discomfort.  If a prescription of Vicodin or Percocet was given, you may also take this medication every 4-6 hours for moderate to severe pain.   If you do not have significant bleeding within 4 hours after placement of Cytotec, you should repeat this process the next day.  If you pass the pregnancy, please make sure you have a follow-up appointment with our office in 1 week.  To ensure complete passage of the pregnancy with either an ultrasound or blood tests at this visit.  Expect bleeding for 1 to 3 weeks after the miscarriage passes, however this bleeding should be light.  If your blood type is Rh negative, please ensure you receive your RhoGam injection within 72 hours of bleeding  If your bleeding stops maxi pad completely within 1 hour, your bleeding to heavily, and should go to the emergency  room for further evaluation.  Cytotec is 86% effective within the first 24 hours and 94% effective within 48 hours with a repeat dose.  If you have no significant bleeding within 48 hours, please call our office.

## 2024-08-12 NOTE — Progress Notes (Signed)
 Pcp, No   No chief complaint on file.   HPI:      Tiffany Watkins is a 33 y.o. G2P0010 whose LMP was Patient's last menstrual period was 04/28/2024., presents today for ED follow up. She is here with her partner [name].   Jessic was seen in the ED on Oct 3 for vaginal bleeding in early pregnancy.  Harper reports she was experiencing spotting for over 1 week then on Oct 3 the spotting turned to heavy bleeding with clots so she went to the ED. Today she is spotting some, she has not passed any clots since, she has not passed anything tissue like either.    On October 3 she had an ultrasound done that showed 1. Single intrauterine gestation with visualized embryo, however no fetal cardiac activity is visualized, concerning for interval fetal demise. An ultrasound on 9/3 showed 1. Single viable IUP, estimated gestational age [redacted] weeks and 5 days by crown-rump length, with ultrasound EDC of 02/28/2025.     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   Elevated testosterone  level in female 01/05/2021   Female hirsutism 01/05/2021   SVT (supraventricular tachycardia) 01/04/2021   PCOS (polycystic ovarian syndrome) 01/01/2021   Anxiety 12/13/2019   Family history of melanoma 12/13/2019    Past Surgical History:  Procedure Laterality Date   WISDOM TOOTH EXTRACTION      Family History  Problem Relation Age of Onset   Hypertension Mother    Melanoma Mother        mets to lungs   Hypertension Father    Alzheimer's disease Father    Breast cancer Neg Hx    Ovarian cancer Neg Hx     Social History   Socioeconomic History   Marital status: Married    Spouse name: Not on file   Number of children: 0   Years of education: Not on file   Highest education level: Some college, no degree  Occupational History   Not on file   Tobacco Use   Smoking status: Former   Smokeless tobacco: Never  Vaping Use   Vaping status: Former  Substance and Sexual Activity   Alcohol use: Not Currently   Drug use: Never   Sexual activity: Yes    Partners: Male    Birth control/protection: None  Other Topics Concern   Not on file  Social History Narrative   Not on file   Social Drivers of Health   Financial Resource Strain: Low Risk  (06/27/2024)   Overall Financial Resource Strain (CARDIA)    Difficulty of Paying Living Expenses: Not hard at all  Food Insecurity: No Food Insecurity (06/27/2024)   Hunger Vital Sign    Worried About Running Out of Food in the Last Year: Never true    Ran Out of Food in the Last Year: Never true  Transportation Needs: No Transportation Needs (06/27/2024)   PRAPARE - Administrator, Civil Service (Medical): No    Lack of Transportation (Non-Medical): No  Physical Activity: Insufficiently Active (06/27/2024)   Exercise Vital Sign    Days of Exercise per Week: 1 day    Minutes of Exercise per Session: 30 min  Stress: No Stress Concern Present (06/27/2024)   [employer] of Occupational Health - Occupational Stress Questionnaire    Feeling of Stress:  Not at all  Social Connections: Moderately Isolated (06/27/2024)   Social Connection and Isolation Panel    Frequency of Communication with Friends and Family: Once a week    Frequency of Social Gatherings with Friends and Family: Never    Attends Religious Services: More than 4 times per year    Active Member of [employer] or Organizations: No    Attends Banker Meetings: Not on file    Marital Status: Married  Catering manager Violence: Not At Risk (06/28/2024)   Humiliation, Afraid, Rape, and Kick questionnaire    Fear of Current or Ex-Partner: No    Emotionally Abused: No    Physically Abused: No    Sexually Abused: No    Outpatient Medications Prior to Visit  Medication Sig Dispense Refill   Prenatal Vit-Fe  Fumarate-FA (PRENATAL PO) Take by mouth. (Patient not taking: Reported on 08/12/2024)     No facility-administered medications prior to visit.      ROS:  Review of Systems see HPI    OBJECTIVE:   Vitals:  BP 115/82 (BP Location: Left Arm, Patient Position: Sitting, Cuff Size: Large)   Pulse 97   Ht 5' 6 (1.676 m)   Wt 288 lb 4.8 oz (130.8 kg)   LMP 04/28/2024   BMI 46.53 kg/m   Physical Exam Constitutional:      Appearance: Normal appearance. She is normal weight.  Cardiovascular:     Rate and Rhythm: Normal rate.  Pulmonary:     Effort: Pulmonary effort is normal.  Genitourinary:    Comments: Exam not necessary for purpose of visit  Musculoskeletal:     Cervical back: Normal range of motion.  Neurological:     General: No focal deficit present.     Mental Status: She is alert.  Psychiatric:        Mood and Affect: Mood normal.        Thought Content: Thought content normal.     Results: No results found for this or any previous visit (from the past 24 hours).   Assessment/Plan: Missed abortion - Plan: Beta HCG, Quant, misoprostol (CYTOTEC) 200 MCG tablet, oxyCODONE  (ROXICODONE ) 5 MG immediate release tablet    Meds ordered this encounter  Medications   misoprostol (CYTOTEC) 200 MCG tablet    Sig: Place four tablets into your vagina, you may repeat the dose in 24 hours    Dispense:  4 tablet    Refill:  1   oxyCODONE  (ROXICODONE ) 5 MG immediate release tablet    Sig: Take 1-2 tablets (5-10 mg total) by mouth every 6 (six) hours as needed for severe pain (pain score 7-10).    Dispense:  4 tablet    Refill:  0    Patient provided with grief resources. Condolences for loss were offered.   The patient was counseled that eghty percent (80%) of people who have a miscarriage will place a pregnancy completely in 8 weeks without intervention. Taking cytotec will increase this percentage to 85-90%.  Taking cytotec at home can be associated with uterine bleeding  and cramping which the patient will need to be prepared to have within 1-4 hours of taking the medication. A support person should be present in case of an emergency. This is an affordable and safe alternative to a dilation and evacuation if precautions are taken.  A patient will need to follow up 2-4 weeks after taking cytotec to make sure that the pregnancy has completely passed. Another option for management of  a incomplete or missed abortion is a dilation and evacuation. Having a dilation and evacuation of the pregnancy will result in a nearly 100% removal of the tissue. Complications such as hemorrhage, infection, uterine perforation, retained tissue or uterine scarring are possible but not common. Risks and benefits of each option discussed. Patient elected for cytotec.   Patient provided with prescription and instructions Bleeding precautions reviewed   O POS  This CNM to call pt on Friday to follow up, anticipate serial lab draws and possible ultrasound.    JINNIE HERO Aniello Christopoulos, CNM 08/12/2024 11:23 AM

## 2024-08-13 ENCOUNTER — Telehealth: Payer: Self-pay | Admitting: Obstetrics

## 2024-08-13 ENCOUNTER — Telehealth: Payer: Self-pay

## 2024-08-13 ENCOUNTER — Emergency Department

## 2024-08-13 ENCOUNTER — Encounter: Admitting: Certified Nurse Midwife

## 2024-08-13 LAB — BETA HCG QUANT (REF LAB): hCG Quant: 4430 m[IU]/mL

## 2024-08-13 LAB — SAMPLE TO BLOOD BANK

## 2024-08-13 MED ORDER — OXYCODONE-ACETAMINOPHEN 5-325 MG PO TABS
1.0000 | ORAL_TABLET | Freq: Once | ORAL | Status: AC
  Administered 2024-08-13: 1 via ORAL
  Filled 2024-08-13: qty 1

## 2024-08-13 NOTE — Consult Note (Signed)
   Consult Note  Requesting Attending Physician :  Tiffany Dunnings, MD Service Requesting Consult : ED  Assessment/Recommendations:  Tiffany Watkins is a 33 y.o. female seen in consultation at the request of Tiffany Dunnings, MD regarding vaginal bleeding following a miscarriage. Clots were removed with ring forceps and moderate BRB followed. EBL (including what was on the pads when I arrived) was approximately 100 mL. Misoprostol 800 mcg was administered buccally. Pain was managed with Perocet and Toradol . Significant reduction in active bleeding noted.  Continue to monitor for heavy bleeding. If bleeding remains minimal over the next few hours and Tiffany Watkins is hemodynamically stable and asymptomatic, may discharge home with outpatient follow-up. If her bleeding becomes heavy again, would recommend considering D&C.  Active Problems:   * No active hospital problems. *   Reason for Consult:  Heavy vaginal bleeding following SAB  Tiffany Watkins is a 33 y.o. G2P0020 who was diagnosed with MAB yesterday. She was prescribed misoprostol and took that around 1500. She began bleeding and cramping around 1945. She reports saturating multiple pads. 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   PCOS (polycystic ovarian syndrome) 01/20/2021   SVT (supraventricular tachycardia)      Surgical History: Past Surgical History:  Procedure Laterality Date   WISDOM TOOTH EXTRACTION       Obstetric History:  OB History  Gravida Para Term Preterm AB Living  2 0 0 0 2 0  SAB IAB Ectopic Multiple Live Births  1 0 1 0 0    # Outcome Date GA Lbr Len/2nd Weight Sex Type Anes PTL Lv  2 SAB 08/12/24 [redacted]w[redacted]d    Biochemical     1 Ectopic 2025            Family  History: family history includes Alzheimer's disease in her father; Hypertension in her father and mother; Melanoma in her mother.  Review of Systems: ROS GI/GU: abdominal cramping, vaginal bleeding  Objective :  Vital signs in last 24 hours: Temp:  [98 F (36.7 C)-98.3 F (36.8 C)] 98 F (36.7 C) (10/07 0117) Pulse Rate:  [92-102] 92 (10/07 0117) Resp:  [18-20] 18 (10/07 0117) BP: (109-147)/(65-103) 116/77 (10/07 0117) SpO2:  [98 %-100 %] 100 % (10/07 0118) Weight:  [130.6 kg-130.8 kg] 130.6 kg (10/06 2115)   Physical Exam: General: alert, cooperative, NAD Abdomen: soft, non-tender Pelvic exam:  Normal vulva and vagina Cervix slightly open with clot visible in os, teased out with ring forceps and gauze.  On first exam, moderate amount of brisk BRB followed removal of clot.  Repeat exam after misoprostol showed dark red blood in vault, small clot in os removed without significant bleeding.  Test Results US  shows absent gestational sac  Follow up for serial quants Return to ED for heavy bleeding, syncope/lightheadedness, s/s of infection  Tiffany Watkins, CNM 08/13/2024 1:37 AM

## 2024-08-13 NOTE — Telephone Encounter (Signed)
 Tiffany Watkins called triage stating she woke up from a really bad headache from the Cyotec she received last night in the ER. I spoke with Dr. Leigh she advised it was okay for her to take Tylenol /Ibuprofen. Pt understood

## 2024-08-13 NOTE — Telephone Encounter (Signed)
 Reached out to pt to schedule appt for SAB f/u and labs for the week of Oct. 13th per Missy.  Left message for pt to call back to schedule.  Possible spot on 10/14, an OB spot.

## 2024-08-15 NOTE — Telephone Encounter (Signed)
 Pt has been scheduled on 08/20/2024 at 11:15 with E. Slaughterbeck.

## 2024-08-16 ENCOUNTER — Telehealth: Payer: Self-pay | Admitting: Licensed Practical Nurse

## 2024-08-16 NOTE — Telephone Encounter (Signed)
 TC to follow up on visit on 10/6 Tiffany Watkins was seen in the ED on 10/7 after taking Cytotec for a MAB. Tiffany Watkins reports she is bleeding like a moderate period, has passed some clots  of varying size, denies cramping. Emotionally she is ok She was seen by Tiffany Watkins CNM while in the ED has follow  up scheduled.  Tiffany Watkins, CNM  [facility] 08/16/2024 1:02 PM

## 2024-08-19 NOTE — Progress Notes (Unsigned)
    GYNECOLOGY PROGRESS NOTE  Subjective:    Patient ID: Jaidy Cottam, female    DOB: 1990/11/11, 33 y.o.   MRN: [id number]  HPI  Patient is a 33 y.o. G43P0020 female who presents for miscarriage. She was evaluated in ED on 08/12/2024 for bleeding with pregnancy. She continues to have bleeding on and off with clots. Cramping comes and goes and they are painful, but controlled with OTC medications. She reports some episode of dizziness and some headaches.   The following portions of the patient's history were reviewed and updated as appropriate: allergies, current medications, past family history, past medical history, past social history, past surgical history, and problem list.  Review of Systems Pertinent items are noted in HPI.   Objective:   Blood pressure 130/84, pulse 95, resp. rate 16, height 5' 6 (1.676 m), weight 288 lb 14.4 oz (131 kg), last menstrual period 04/28/2024. Body mass index is 46.63 kg/m. General appearance: alert, cooperative, and no distress   Assessment:   1. Missed abortion      Plan:   Follow up for ED visit for missed AB. She was seen by an AOB provider in 10/7 after using misoprostol for MAB management. She went to the ER with heavy bleeding that was manages with an additional administration of misoprostol.   Today we discussed the follow to a SAB including labs and imaging. Will order ultrasound due to continued clotting and some episode of heavier bleeding. Bleeding is stable today. Will follow HCG levels until below 5.  Reviewed most SAB with unknown etiology. Discussed future fertility. Offer comfort for the grieving process.    Damien Parsley, CNM [facility]

## 2024-08-20 ENCOUNTER — Ambulatory Visit: Admitting: Certified Nurse Midwife

## 2024-08-20 ENCOUNTER — Encounter: Payer: Self-pay | Admitting: Certified Nurse Midwife

## 2024-08-20 VITALS — BP 130/84 | HR 95 | Resp 16 | Ht 66.0 in | Wt 288.9 lb

## 2024-08-20 DIAGNOSIS — O021 Missed abortion: Secondary | ICD-10-CM | POA: Diagnosis not present

## 2024-08-21 ENCOUNTER — Ambulatory Visit (INDEPENDENT_AMBULATORY_CARE_PROVIDER_SITE_OTHER): Admitting: Registered Nurse

## 2024-08-21 ENCOUNTER — Ambulatory Visit (INDEPENDENT_AMBULATORY_CARE_PROVIDER_SITE_OTHER)

## 2024-08-21 ENCOUNTER — Encounter: Payer: Self-pay | Admitting: Registered Nurse

## 2024-08-21 VITALS — BP 124/89 | HR 93 | Ht 66.0 in | Wt 286.3 lb

## 2024-08-21 DIAGNOSIS — O021 Missed abortion: Secondary | ICD-10-CM

## 2024-08-21 LAB — CBC
Hematocrit: 39.4 % (ref 34.0–46.6)
Hemoglobin: 12.9 g/dL (ref 11.1–15.9)
MCH: 28.9 pg (ref 26.6–33.0)
MCHC: 32.7 g/dL (ref 31.5–35.7)
MCV: 88 fL (ref 79–97)
Platelets: 353 x10E3/uL (ref 150–450)
RBC: 4.46 x10E6/uL (ref 3.77–5.28)
RDW: 12.2 % (ref 11.7–15.4)
WBC: 11.3 x10E3/uL — ABNORMAL HIGH (ref 3.4–10.8)

## 2024-08-21 LAB — BETA HCG QUANT (REF LAB): hCG Quant: 71 m[IU]/mL

## 2024-08-22 ENCOUNTER — Encounter: Payer: Self-pay | Admitting: Licensed Practical Nurse

## 2024-08-22 ENCOUNTER — Telehealth: Payer: Self-pay

## 2024-08-22 ENCOUNTER — Encounter: Payer: Self-pay | Admitting: Obstetrics

## 2024-08-22 NOTE — Progress Notes (Signed)
 Pt treated with Cytotec for a MAB by this CNM On 10/6, she was seen and discharged from the ED on 10/6. She was seen in the office by Damien Parsley  CNM on 10/14, her beta was 6, she had US  that showed retained POC, she was then seen by Lauraine Lakes CNM  on 10/15, she was adequately counseled on management including expectant management, repeat cytotec or a D and C. Pt called the office upset feeling like she is getting the run around, she would like a D and C and nobody is helping.   TC to pt, pt reports she has had some cramping, her bleeding is light. Emotionally she is stuck, she feels like she will not be able to move on until she has a D and C. She understands that she may not necessarily need the D and C, but she would like it. She is frustrated because she would like to know when she will have the procedure,. Explained that she first needs to see a surgeon for a pre-op appointment. A MD is not in the office tomorrow, at best you may be able to be seen on Monday. I am not sure when the d and C would happen, Monday is the normal operating day for our practice. Your US  shows POC, but sometimes you have actually passed everything and US  is not accurate, your beta was 71, this is reassuring. The cramping you are experiencing could also mean your body is trying to expel the remainder of POC. I will message the front desk to schedule you an apt with Dr Starla on Monday. Pt also reports she has had trouble sleeping. She is going to try [employer]. She may ask for a prescription for a sleep aid if it doe snot work.  Pt verbalized understanding. Pt was previously counseled on bleeding precautions.   [employer] worker, international aid to schedule apt with Dr Starla Jinnie Cookey, CNM  [facility] 08/22/24  6:01 PM

## 2024-08-22 NOTE — Telephone Encounter (Signed)
 Pt calling triage states that she spoke with you yesterday and wanted to go ahead with the [facility] for Monday. Pt is waiting on a call to be scheduled. She said you was going to speak with Dr Leigh.She does not want to push this ut for weeks. I advised her you was on call tomorrow. I could send the message. Pt reports having cramps and not feeling well. Pt is very frustrated feels like she is getting the runaround and needs answers. I sent this to Jinnie also to see what she could advise on pt not feeling well. Cramps but no bleeding.

## 2024-08-23 NOTE — Progress Notes (Unsigned)
 @[facility] @   GYNECOLOGY PREOPERATIVE HISTORY AND PHYSICAL   Subjective:  Tiffany Watkins is a 33 y.o. G2P0020 here for surgical management of retained POC in the form of a Dilation and Curettage.   Indications for   She has used cytotec twice for a miscarriage but ultrasound still showed retained POC and her QBHCG was 71. 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  Family History  Problem Relation Age of Onset   Hypertension Mother    Melanoma Mother        mets to lungs   Hypertension Father    Alzheimer's disease Father    Breast cancer Neg Hx    Ovarian cancer Neg Hx    Social History   Socioeconomic History   Marital status: Married    Spouse name: Not on file   Number of children: 0   Years of education: Not on file   Highest education level: Some college, no degree  Occupational History   Not on file  Tobacco Use   Smoking status: Former   Smokeless tobacco: Never  Advertising account planner   Vaping status: Former  Substance and Sexual Activity   Alcohol use: Not Currently   Drug use: Never   Sexual activity: Yes    Partners: Male    Birth control/protection:  None  Other Topics Concern   Not on file  Social History Narrative   Not on file   Social Drivers of Health   Financial Resource Strain: Low Risk  (06/27/2024)   Overall Financial Resource Strain (CARDIA)    Difficulty of Paying Living Expenses: Not hard at all  Food Insecurity: No Food Insecurity (06/27/2024)   Hunger Vital Sign    Worried About Running Out of Food in the Last Year: Never true    Ran Out of Food in the Last Year: Never true  Transportation Needs: No Transportation Needs (06/27/2024)   PRAPARE - Administrator, Civil Service (Medical): No    Lack of Transportation (Non-Medical): No  Physical Activity: Insufficiently Active (06/27/2024)   Exercise Vital Sign    Days of Exercise per Week: 1 day    Minutes of Exercise per Session: 30 min  Stress: No Stress Concern Present (06/27/2024)   [employer] of Occupational Health - Occupational Stress Questionnaire    Feeling of Stress: Not at all  Social Connections: Moderately Isolated (06/27/2024)   Social Connection and Isolation Panel    Frequency of Communication with Friends and Family: Once  a week    Frequency of Social Gatherings with Friends and Family: Never    Attends Religious Services: More than 4 times per year    Active Member of [employer] or Organizations: No    Attends Banker Meetings: Not on file    Marital Status: Married  Catering manager Violence: Not At Risk (06/28/2024)   Humiliation, Afraid, Rape, and Kick questionnaire    Fear of Current or Ex-Partner: No    Emotionally Abused: No    Physically Abused: No    Sexually Abused: No   No current outpatient medications on file prior to visit.   No current facility-administered medications on file prior to visit.   No Known Allergies    Review of Systems Constitutional: No recent fever/chills/sweats Respiratory: No recent cough/bronchitis Cardiovascular: No chest pain Gastrointestinal: No recent  nausea/vomiting/diarrhea Genitourinary: No UTI symptoms Hematologic/lymphatic:No history of coagulopathy or recent blood thinner use    Objective:   Last menstrual period 04/28/2024. CONSTITUTIONAL: Well-developed, well-nourished female in no acute distress.  HENT:  Normocephalic, atraumatic, External right and left ear normal. Oropharynx is clear and moist EYES: Conjunctivae and EOM are normal. Pupils are equal, round, and reactive to light. No scleral icterus.  NECK: Normal range of motion, supple, no masses SKIN: Skin is warm and dry. No rash noted. Not diaphoretic. No erythema. No pallor. NEUROLOGIC: Alert and oriented to person, place, and time. Normal reflexes, muscle tone coordination. No cranial nerve deficit noted. PSYCHIATRIC: Normal mood and affect. Normal behavior. Normal judgment and thought content. CARDIOVASCULAR: Normal heart rate noted, regular rhythm RESPIRATORY: Effort and breath sounds normal, no problems with respiration noted ABDOMEN: Soft, nontender, nondistended. PELVIC: Deferred MUSCULOSKELETAL: Normal range of motion. No edema and no tenderness. 2+ distal pulses.    Labs: Results for orders placed or performed in visit on 08/20/24 (from the past 2 weeks)  Beta hCG quant (ref lab)   Collection Time: 08/20/24 11:47 AM  Result Value Ref Range   hCG Quant 71 mIU/mL  CBC   Collection Time: 08/20/24 11:47 AM  Result Value Ref Range   WBC 11.3 (H) 3.4 - 10.8 x10E3/uL   RBC 4.46 3.77 - 5.28 x10E6/uL   Hemoglobin 12.9 11.1 - 15.9 g/dL   Hematocrit 60.5 65.9 - 46.6 %   MCV 88 79 - 97 fL   MCH 28.9 26.6 - 33.0 pg   MCHC 32.7 31.5 - 35.7 g/dL   RDW 87.7 88.2 - 84.5 %   Platelets 353 150 - 450 x10E3/uL  Results for orders placed or performed during the hospital encounter of 08/12/24 (from the past 2 weeks)  CBC with Differential/Platelet   Collection Time: 08/12/24  9:19 PM  Result Value Ref Range   WBC 12.5 (H) 4.0 - 10.5 K/uL   RBC 4.69 3.87 - 5.11 MIL/uL    Hemoglobin 13.5 12.0 - 15.0 g/dL   HCT 59.9 63.9 - 53.9 %   MCV 85.3 80.0 - 100.0 fL   MCH 28.8 26.0 - 34.0 pg   MCHC 33.8 30.0 - 36.0 g/dL   RDW 87.1 88.4 - 84.4 %   Platelets 311 150 - 400 K/uL   nRBC 0.0 0.0 - 0.2 %   Neutrophils Relative % 57 %   Neutro Abs 7.1 1.7 - 7.7 K/uL   Lymphocytes Relative 36 %   Lymphs Abs 4.5 (H) 0.7 - 4.0 K/uL   Monocytes Relative 4 %   Monocytes Absolute 0.5 0.1 - 1.0 K/uL   Eosinophils Relative  2 %   Eosinophils Absolute 0.2 0.0 - 0.5 K/uL   Basophils Relative 1 %   Basophils Absolute 0.1 0.0 - 0.1 K/uL   RBC Morphology MORPHOLOGY UNREMARKABLE    Smear Review Normal platelet morphology    Immature Granulocytes 0 %   Abs Immature Granulocytes 0.05 0.00 - 0.07 K/uL   Reactive, Benign Lymphocytes PRESENT   hCG, quantitative, pregnancy   Collection Time: 08/12/24  9:19 PM  Result Value Ref Range   hCG, Beta Chain, Quant, S 5,182 (H) <5 mIU/mL  Sample to Blood Bank   Collection Time: 08/12/24  9:19 PM  Result Value Ref Range   Blood Bank Specimen SAMPLE AVAILABLE FOR TESTING    Sample Expiration      08/15/2024,2359 Performed at [facility], [address] [place], [place] [address]   Urinalysis, Routine w reflex microscopic -Urine, Clean Catch   Collection Time: 08/12/24  9:21 PM  Result Value Ref Range   Color, Urine RED (A) YELLOW   APPearance TURBID (A) CLEAR   Specific Gravity, Urine  1.005 - 1.030    TEST NOT REPORTED DUE TO COLOR INTERFERENCE OF URINE PIGMENT   pH  5.0 - 8.0    TEST NOT REPORTED DUE TO COLOR INTERFERENCE OF URINE PIGMENT   Glucose, UA (A) NEGATIVE mg/dL    TEST NOT REPORTED DUE TO COLOR INTERFERENCE OF URINE PIGMENT   Hgb urine dipstick (A) NEGATIVE    TEST NOT REPORTED DUE TO COLOR INTERFERENCE OF URINE PIGMENT   Bilirubin Urine (A) NEGATIVE    TEST NOT REPORTED DUE TO COLOR INTERFERENCE OF URINE PIGMENT   Ketones, ur (A) NEGATIVE mg/dL    TEST NOT REPORTED DUE TO COLOR INTERFERENCE OF URINE  PIGMENT   Protein, ur (A) NEGATIVE mg/dL    TEST NOT REPORTED DUE TO COLOR INTERFERENCE OF URINE PIGMENT   Nitrite (A) NEGATIVE    TEST NOT REPORTED DUE TO COLOR INTERFERENCE OF URINE PIGMENT   Leukocytes,Ua (A) NEGATIVE    TEST NOT REPORTED DUE TO COLOR INTERFERENCE OF URINE PIGMENT   RBC / HPF >50 0 - 5 RBC/hpf   WBC, UA >50 0 - 5 WBC/hpf   Bacteria, UA NONE SEEN NONE SEEN   Squamous Epithelial / HPF 0 0 - 5 /HPF  Results for orders placed or performed in visit on 08/12/24 (from the past 2 weeks)  Beta HCG, Quant   Collection Time: 08/12/24 11:24 AM  Result Value Ref Range   hCG Quant 4,430 mIU/mL  Results for orders placed or performed during the hospital encounter of 08/09/24 (from the past 2 weeks)  Urinalysis, Routine w reflex microscopic -Urine, Clean Catch   Collection Time: 08/09/24  9:09 PM  Result Value Ref Range   Color, Urine YELLOW (A) YELLOW   APPearance CLEAR (A) CLEAR   Specific Gravity, Urine 1.025 1.005 - 1.030   pH 5.0 5.0 - 8.0   Glucose, UA NEGATIVE NEGATIVE mg/dL   Hgb urine dipstick MODERATE (A) NEGATIVE   Bilirubin Urine NEGATIVE NEGATIVE   Ketones, ur NEGATIVE NEGATIVE mg/dL   Protein, ur NEGATIVE NEGATIVE mg/dL   Nitrite NEGATIVE NEGATIVE   Leukocytes,Ua NEGATIVE NEGATIVE   RBC / HPF 6-10 0 - 5 RBC/hpf   WBC, UA 0-5 0 - 5 WBC/hpf   Bacteria, UA NONE SEEN NONE SEEN   Squamous Epithelial / HPF 0-5 0 - 5 /HPF   Mucus PRESENT   Basic metabolic panel   Collection Time: 08/09/24  9:17  PM  Result Value Ref Range   Sodium 138 135 - 145 mmol/L   Potassium 4.5 3.5 - 5.1 mmol/L   Chloride 101 98 - 111 mmol/L   CO2 25 22 - 32 mmol/L   Glucose, Bld 112 (H) 70 - 99 mg/dL   BUN 11 6 - 20 mg/dL   Creatinine, Ser 9.25 0.44 - 1.00 mg/dL   Calcium 9.1 8.9 - 89.6 mg/dL   GFR, Estimated >39 >39 mL/min   Anion gap 12 5 - 15  CBC with Differential/Platelet   Collection Time: 08/09/24  9:17 PM  Result Value Ref Range   WBC 13.0 (H) 4.0 - 10.5 K/uL   RBC 4.77  3.87 - 5.11 MIL/uL   Hemoglobin 13.7 12.0 - 15.0 g/dL   HCT 59.1 63.9 - 53.9 %   MCV 85.5 80.0 - 100.0 fL   MCH 28.7 26.0 - 34.0 pg   MCHC 33.6 30.0 - 36.0 g/dL   RDW 87.0 88.4 - 84.4 %   Platelets 303 150 - 400 K/uL   nRBC 0.0 0.0 - 0.2 %   Neutrophils Relative % 56 %   Neutro Abs 7.7 1.7 - 7.7 K/uL   Lymphocytes Relative 35 %   Lymphs Abs 4.7 (H) 0.7 - 4.0 K/uL   Monocytes Relative 5 %   Monocytes Absolute 0.6 0.1 - 1.0 K/uL   Eosinophils Relative 2 %   Eosinophils Absolute 0.2 0.0 - 0.5 K/uL   Basophils Relative 1 %   Basophils Absolute 0.1 0.0 - 0.1 K/uL   WBC Morphology MORPHOLOGY UNREMARKABLE    RBC Morphology MORPHOLOGY UNREMARKABLE    Smear Review Normal platelet morphology    Immature Granulocytes 1 %   Abs Immature Granulocytes 0.06 0.00 - 0.07 K/uL  POC urine preg, ED   Collection Time: 08/09/24  9:21 PM  Result Value Ref Range   Preg Test, Ur POSITIVE (A) NEGATIVE     Imaging Studies: US  PELVIS TRANSVAGINAL NON-OB (TV ONLY) Result Date: 08/22/2024 Images from the original result were not included. GYN ULTRASOUND REPORT Patient Name: Tiffany Watkins DOB: [birth year] MRN: [id number] LMP: Location: [facility] Date of Service: 08/21/2024 Ordering Provider: Damien Parsley, CNM Indications:Follow-up miscarriage/ ?? POC Findings: The uterus is anteverted and appears normal. Echo texture is homogenous without evidence of focal masses. The Endometrium measures 16.6 mm and there does appear to contain POC. (Increased blood flow noted). Right Ovary measures 2.6 x 1.8 x 1.6 cm. It is normal in appearance for PCOS. Left Ovary measures 2.2 x 1.9 x 1.9 cm. It is normal in appearance PCOS. Survey of the adnexa demonstrates no adnexal masses. There is no free fluid in the cul de sac. Impression: 1.  Retained POC was still seen on todays scan. 2. Both ovaries seem to be consistent with PCOS. Recommendations: 1.Clinical correlation with the patient's History and  Physical Exam. 2. Patient to see Lauraine Lakes, CNM in the office today. [place] DELENA Hummer, RDMS (AB,OB,BR),RVT Clinical Impression and recommendations: I have reviewed the sonogram results above, combined with the patient's current clinical course, below are my impressions and any appropriate recommendations for management based on the sonographic findings. Normal uterine size and shape. Thickened endometrium with vascularity Normal ovaries bilaterally- may be evidence of multiple peripheral follicles suggestive of PCOS; however, would recommend follow up imaging and clinical correlation to confirm. Concern for retained products of conception Jennifer M Ozan 08/22/2024 11:23 AM  US  OB LESS THAN 14 WEEKS WITH OB TRANSVAGINAL Result Date:  08/13/2024 CLINICAL DATA:  Concern for retained products of conception following recent miscarriage EXAM: OBSTETRIC <14 WK US  AND TRANSVAGINAL OB US  TECHNIQUE: Both transabdominal and transvaginal ultrasound examinations were performed for complete evaluation of the gestation as well as the maternal uterus, adnexal regions, and pelvic cul-de-sac. Transvaginal technique was performed to assess early pregnancy. COMPARISON:  None Available. FINDINGS: Intrauterine gestational sac: Absent Maternal uterus/adnexae: Diffusely thickened endometrium with increased vascularity is noted. The gestational sac seen 4 days previous is not well appreciated. Ovaries are within normal limits. IMPRESSION: Diffusely thickened endometrium with increased vascularity. This suggests possibility of retained products of conception. The previously seen gestational sac is no longer visualized. Electronically Signed   By: Oneil Devonshire M.D.   On: 08/13/2024 01:15   US  OB LESS THAN 14 WEEKS WITH OB TRANSVAGINAL Result Date: 08/09/2024 CLINICAL DATA:  Vaginal bleeding EXAM: OBSTETRIC <14 WK US  AND TRANSVAGINAL OB US  TECHNIQUE: Both transabdominal and transvaginal ultrasound examinations were performed for  complete evaluation of the gestation as well as the maternal uterus, adnexal regions, and pelvic cul-de-sac. Transvaginal technique was performed to assess early pregnancy. COMPARISON:  Ultrasound 07/10/2024 FINDINGS: Intrauterine gestational sac: Single irregular intrauterine gestational sac. Yolk sac:  Not visualized Embryo:  Visualized Cardiac Activity: Not visualized CRL:  10.2 mm   7 w   4 d                  US  EDC: 03/27/2025 Subchorionic hemorrhage:  None visualized. Maternal uterus/adnexae: Ovaries are within normal limits. Right ovary measures 2.1 by 2.3 x 1.8 cm. Left ovary measures 2.2 x 1.9 x 1.6 cm. No significant free fluid IMPRESSION: 1. Single intrauterine gestation with visualized embryo, however no fetal cardiac activity is visualized, concerning for interval fetal demise. Electronically Signed   By: Luke Bun M.D.   On: 08/09/2024 23:00    Assessment:     Retained POC      Plan:    Counseling: Procedure, risks, reasons, benefits and complications (including injury to bowel, bladder, major blood vessel, ureter, bleeding, possibility of transfusion, infection, or fistula formation) reviewed in detail. Likelihood of success in alleviating the patient's condition was discussed. Routine postoperative instructions will be reviewed with the patient and her family in detail after surgery.  The patient concurred with the proposed plan, giving informed written consent for the surgery.   Preop testing ordered. Instructions reviewed, including NPO after midnight.      Harland Birkenhead, MD [facility]

## 2024-08-26 ENCOUNTER — Ambulatory Visit (INDEPENDENT_AMBULATORY_CARE_PROVIDER_SITE_OTHER): Admitting: Obstetrics & Gynecology

## 2024-08-26 ENCOUNTER — Encounter: Payer: Self-pay | Admitting: Obstetrics & Gynecology

## 2024-08-26 VITALS — BP 123/83 | HR 89 | Ht 66.0 in | Wt 293.5 lb

## 2024-08-26 DIAGNOSIS — O034 Incomplete spontaneous abortion without complication: Secondary | ICD-10-CM

## 2024-08-26 DIAGNOSIS — Z01818 Encounter for other preprocedural examination: Secondary | ICD-10-CM

## 2024-08-26 DIAGNOSIS — O021 Missed abortion: Secondary | ICD-10-CM | POA: Diagnosis not present

## 2024-08-26 NOTE — Progress Notes (Signed)
 Pt seen as a workin. She had evaluation earlier in the week for f/u from SAB. Ultrasound today showed possible retained POCs. BHCG was 71 on 10/14. Cramping coming and going. Bleeding is spotting to heavier bleeding. Passed large clot (golf ball) this morning after having intense cramping last evening.   We discussed options moving forward including expectant mgmgt, another course of misoprostol or D&C. I counseled her that D&C may be more intervention than necessary given falling HCG and slowing bleeding. Pt wanted to go home and discuss with her partner.   I received a message to call the patient back at the end of the clinic day. She desires definitive tx. Wants D&C regardless of HCG. Wants deep sedation/ not to hear or remember anything.   Will work toward [facility] now. Will reach out to Dr. Leigh to enquire about logistics. Reviewed bleeding precautions and presenting to ED if bleeding excessively prior to [facility] being performed.

## 2024-08-26 NOTE — Telephone Encounter (Signed)
 Pt has spoken with multiple providers and staff and has been given info multiple times on needing pre-op prior to D&C. She is scheduled to see Dr. Starla 08/26/24 at 8:35a for a pre-op.

## 2024-08-27 ENCOUNTER — Other Ambulatory Visit

## 2024-08-27 ENCOUNTER — Telehealth: Payer: Self-pay | Admitting: Obstetrics & Gynecology

## 2024-08-27 LAB — BETA HCG QUANT (REF LAB): hCG Quant: 20 m[IU]/mL

## 2024-08-27 NOTE — Telephone Encounter (Signed)
 Surgery has been cancelled per provider [facility] and has ordered beta labs.

## 2024-08-28 ENCOUNTER — Inpatient Hospital Stay: Admission: RE | Admit: 2024-08-28 | Source: Ambulatory Visit

## 2024-08-29 NOTE — Telephone Encounter (Signed)
 Patient called in this morning due to concerns of a bright red gush of blood yesterday. She states no bleeding today but is unsure of what route she needs to continue to take now that Dr. Starla cancelled her D&C. Patient is wanting to put this past her and would like to know if she needs an ultrasound, labs or to have a D&C. Please advise on next steps. Most recent beta lab on 10/20 was 20.

## 2024-09-02 ENCOUNTER — Ambulatory Visit: Admit: 2024-09-02 | Admitting: Obstetrics & Gynecology

## 2024-09-02 SURGERY — DILATION AND EVACUATION, UTERUS
Anesthesia: Choice

## 2024-09-03 ENCOUNTER — Other Ambulatory Visit

## 2024-09-04 LAB — BETA HCG QUANT (REF LAB): hCG Quant: 5 m[IU]/mL

## 2024-09-09 ENCOUNTER — Other Ambulatory Visit: Payer: Self-pay | Admitting: Obstetrics

## 2024-09-09 ENCOUNTER — Ambulatory Visit: Payer: Self-pay | Admitting: Obstetrics

## 2024-09-09 DIAGNOSIS — O039 Complete or unspecified spontaneous abortion without complication: Secondary | ICD-10-CM

## 2024-09-11 ENCOUNTER — Other Ambulatory Visit

## 2024-09-11 DIAGNOSIS — O039 Complete or unspecified spontaneous abortion without complication: Secondary | ICD-10-CM

## 2024-09-12 LAB — BETA HCG QUANT (REF LAB): hCG Quant: 2 m[IU]/mL

## 2024-09-13 ENCOUNTER — Ambulatory Visit: Payer: Self-pay | Admitting: Obstetrics

## 2024-11-14 ENCOUNTER — Other Ambulatory Visit: Payer: Self-pay | Admitting: Internal Medicine

## 2024-11-14 DIAGNOSIS — I471 Supraventricular tachycardia, unspecified: Secondary | ICD-10-CM

## 2024-11-14 DIAGNOSIS — R002 Palpitations: Secondary | ICD-10-CM

## 2024-11-14 DIAGNOSIS — R0789 Other chest pain: Secondary | ICD-10-CM

## 2024-11-14 DIAGNOSIS — G4733 Obstructive sleep apnea (adult) (pediatric): Secondary | ICD-10-CM

## 2024-12-06 ENCOUNTER — Emergency Department

## 2024-12-06 ENCOUNTER — Other Ambulatory Visit: Payer: Self-pay

## 2024-12-06 ENCOUNTER — Emergency Department
Admission: EM | Admit: 2024-12-06 | Discharge: 2024-12-06 | Disposition: A | Discharge status: Home / Self Care | Attending: Emergency Medicine | Admitting: Emergency Medicine

## 2024-12-06 DIAGNOSIS — Z3A01 Less than 8 weeks gestation of pregnancy: Secondary | ICD-10-CM | POA: Diagnosis not present

## 2024-12-06 DIAGNOSIS — O4690 Antepartum hemorrhage, unspecified, unspecified trimester: Secondary | ICD-10-CM

## 2024-12-06 DIAGNOSIS — O209 Hemorrhage in early pregnancy, unspecified: Secondary | ICD-10-CM | POA: Diagnosis present

## 2024-12-06 LAB — URINALYSIS, ROUTINE W REFLEX MICROSCOPIC
Bacteria, UA: NONE SEEN
Bilirubin Urine: NEGATIVE
Glucose, UA: NEGATIVE mg/dL
Ketones, ur: NEGATIVE mg/dL
Leukocytes,Ua: NEGATIVE
Nitrite: NEGATIVE
Protein, ur: NEGATIVE mg/dL
Specific Gravity, Urine: 1.021 (ref 1.005–1.030)
pH: 5 (ref 5.0–8.0)

## 2024-12-06 LAB — CBC WITH DIFFERENTIAL/PLATELET
Abs Immature Granulocytes: 0.06 10*3/uL (ref 0.00–0.07)
Basophils Absolute: 0.1 10*3/uL (ref 0.0–0.1)
Basophils Relative: 0 %
Eosinophils Absolute: 0.2 10*3/uL (ref 0.0–0.5)
Eosinophils Relative: 1 %
HCT: 40.5 % (ref 36.0–46.0)
Hemoglobin: 12.9 g/dL (ref 12.0–15.0)
Immature Granulocytes: 1 %
Lymphocytes Relative: 18 %
Lymphs Abs: 2 10*3/uL (ref 0.7–4.0)
MCH: 26.1 pg (ref 26.0–34.0)
MCHC: 31.9 g/dL (ref 30.0–36.0)
MCV: 81.8 fL (ref 80.0–100.0)
Monocytes Absolute: 0.8 10*3/uL (ref 0.1–1.0)
Monocytes Relative: 7 %
Neutro Abs: 8.1 10*3/uL — ABNORMAL HIGH (ref 1.7–7.7)
Neutrophils Relative %: 73 %
Platelets: 325 10*3/uL (ref 150–400)
RBC: 4.95 MIL/uL (ref 3.87–5.11)
RDW: 14.4 % (ref 11.5–15.5)
WBC: 11.1 10*3/uL — ABNORMAL HIGH (ref 4.0–10.5)
nRBC: 0 % (ref 0.0–0.2)

## 2024-12-06 LAB — COMPREHENSIVE METABOLIC PANEL WITH GFR
ALT: 26 U/L (ref 0–44)
AST: 20 U/L (ref 15–41)
Albumin: 4.2 g/dL (ref 3.5–5.0)
Alkaline Phosphatase: 80 U/L (ref 38–126)
Anion gap: 13 (ref 5–15)
BUN: 7 mg/dL (ref 6–20)
CO2: 21 mmol/L — ABNORMAL LOW (ref 22–32)
Calcium: 9.5 mg/dL (ref 8.9–10.3)
Chloride: 103 mmol/L (ref 98–111)
Creatinine, Ser: 0.67 mg/dL (ref 0.44–1.00)
GFR, Estimated: >60 mL/min
Glucose, Bld: 109 mg/dL — ABNORMAL HIGH (ref 70–99)
Potassium: 4.1 mmol/L (ref 3.5–5.1)
Sodium: 137 mmol/L (ref 135–145)
Total Bilirubin: 0.3 mg/dL (ref 0.0–1.2)
Total Protein: 7.3 g/dL (ref 6.5–8.1)

## 2024-12-06 LAB — HCG, QUANTITATIVE, PREGNANCY: hCG, Beta Chain, Quant, S: 18988 m[IU]/mL — ABNORMAL HIGH

## 2024-12-06 LAB — POC URINE PREG, ED: Preg Test, Ur: POSITIVE — AB

## 2024-12-06 NOTE — ED Provider Notes (Signed)
 "  [facility] Provider Note    Event Date/Time   First MD Initiated Contact with Patient 12/06/24 0408     (approximate)   History   Chief Complaint: Vaginal Bleeding   HPI  Tiffany Watkins is a 34 y.o. female with history PCOS, ectopic pregnancy treated with methotrexate , spontaneous abortion, currently [redacted] weeks pregnant by dates comes to the ED complaining of vaginal bleeding that started tonight about 2:30 AM.  Also passed a few clots.  Did not notice any tissue.  Denies pain pressure or fever.  No unusual vaginal discharge.  No dysuria or other urinary symptoms.        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   PCOS (polycystic ovarian syndrome) 01/20/2021   SVT (supraventricular tachycardia)       Past Surgical History:  Procedure Laterality Date   WISDOM TOOTH EXTRACTION      Physical Exam   Triage Vital Signs: ED Triage Vitals  Encounter Vitals Group     BP 12/06/24 0405 134/87     Girls Systolic BP Percentile --      Girls Diastolic BP Percentile --      Boys Systolic BP Percentile --      Boys Diastolic BP Percentile --      Pulse Rate 12/06/24 0404 (!) 140     Resp 12/06/24 0404 20     Temp 12/06/24 0404 98.5 F (36.9 C)     Temp Source 12/06/24 0404 Oral     SpO2 12/06/24 0404 100 %     Weight 12/06/24 0402 280 lb (127 kg)     Height 12/06/24 0402 5' 6 (1.676 m)     Head Circumference --      Peak Flow --      Pain Score 12/06/24 0402 3     Pain Loc --      Pain Education --      Exclude from Growth Chart --     Most recent vital signs: Vitals:   12/06/24 0405 12/06/24 0431  BP: 134/87   Pulse:  (!) 110  Resp:  14  Temp:    SpO2:  96%    General: Awake, no distress.  CV:  Good peripheral perfusion.  Tachycardia heart rate 100 Resp:  Normal effort.   Abd:  No distention.  Soft, mild discomfort with suprapubic palpation Other:     ED Results / Procedures / Treatments   Labs (all labs ordered are listed, but only abnormal results are displayed) Labs Reviewed  CBC WITH DIFFERENTIAL/PLATELET - Abnormal; Notable for the following components:      Result Value   WBC 11.1 (*)    Neutro Abs 8.1 (*)    All other components within normal limits  URINALYSIS, ROUTINE W REFLEX MICROSCOPIC - Abnormal; Notable for the following components:   Color, Urine YELLOW (*)    APPearance CLEAR (*)    Hgb urine dipstick SMALL (*)    All other components within normal limits  HCG, QUANTITATIVE, PREGNANCY - Abnormal; Notable for the following components:   hCG, Beta Chain, Quant, S [id number] (*)    All other components within normal limits  COMPREHENSIVE METABOLIC PANEL WITH GFR - Abnormal; Notable for the following components:   CO2 21 (*)  Glucose, Bld 109 (*)    All other components within normal limits  POC URINE PREG, ED - Abnormal; Notable for the following components:   Preg Test, Ur Positive (*)    All other components within normal limits     EKG    RADIOLOGY Ultrasound interpreted by me, shows IUP.  Radiology report reviewed   PROCEDURES:  Procedures   MEDICATIONS ORDERED IN ED: Medications - No data to display   IMPRESSION / MDM / ASSESSMENT AND PLAN / ED COURSE  I reviewed the triage vital signs and the nursing notes.  DDx: Threatened miscarriage, spontaneous abortion, UTI, ectopic pregnancy  Patient's presentation is most consistent with acute presentation with potential threat to life or bodily function.     Clinical Course as of 12/06/24 [id number]  [name] Dec 06, 2024  0420 Presents with vaginal bleeding, currently 6 weeks 4 days pregnant.  No significant pain, has recently had fever and chills related to upper respiratory infection.  Previous type and screen shows patient's blood type is O+.  Will obtain ultrasound [PS]     Clinical Course User Index [PS] Viviann Pastor, MD     ----------------------------------------- 6:36 AM on 12/06/2024 ----------------------------------------- Ultrasound confirms IUP, indeterminate viability.  hCG 19,000.  Has appointment to see [facility] in 2 weeks.  Patient will call to move up this appointment if possible.  Stable for discharge  FINAL CLINICAL IMPRESSION(S) / ED DIAGNOSES   Final diagnoses:  Vaginal bleeding in pregnancy     Rx / DC Orders   ED Discharge Orders     None        Note:  This document was prepared using Dragon voice recognition software and may include unintentional dictation errors.   Viviann Pastor, MD 12/06/24 [phone]  "

## 2024-12-06 NOTE — ED Triage Notes (Signed)
 Pt reports [redacted] wks pregnant. Onset of vaginal bleeding this AM ~0230. Reports some dull pressure in R ovary area. Reports bright red bleeding with passage of a large number of clots. Denies n/v/d. Pt ambulatory. Alert and oriented following commands. Breathing unlabored and speaking in full sentences. Symmetric chest rise and fall.

## 2024-12-06 NOTE — Discharge Instructions (Signed)
 Your beta-HCG level today is 19,000. Please follow up with your Obstetrics office for further evaluation and monitoring of your pregnancy.
